# Patient Record
Sex: Female | Born: 1940 | Race: White | Hispanic: No | Marital: Married | Smoking: Never smoker
Health system: Southern US, Community
[De-identification: ages and names within clinical notes are randomized; demographics above are authoritative.]

## PROBLEM LIST (undated history)

## (undated) DIAGNOSIS — I10 Essential (primary) hypertension: Secondary | ICD-10-CM

## (undated) DIAGNOSIS — R42 Dizziness and giddiness: Secondary | ICD-10-CM

## (undated) HISTORY — PX: OTHER SURGICAL HISTORY: SHX169

## (undated) HISTORY — PX: CATARACT EXTRACTION: SUR2

## (undated) HISTORY — DX: Dizziness and giddiness: R42

## (undated) HISTORY — DX: Essential (primary) hypertension: I10

---

## 1973-04-01 HISTORY — PX: PILONIDAL CYST EXCISION: SHX744

## 1998-02-20 ENCOUNTER — Other Ambulatory Visit: Admission: RE | Admit: 1998-02-20 | Discharge: 1998-02-20 | Payer: Self-pay | Admitting: Obstetrics and Gynecology

## 1999-02-21 ENCOUNTER — Other Ambulatory Visit: Admission: RE | Admit: 1999-02-21 | Discharge: 1999-02-21 | Payer: Self-pay | Admitting: Obstetrics and Gynecology

## 2000-05-02 ENCOUNTER — Other Ambulatory Visit: Admission: RE | Admit: 2000-05-02 | Discharge: 2000-05-02 | Payer: Self-pay | Admitting: Obstetrics and Gynecology

## 2000-06-03 ENCOUNTER — Encounter: Admission: RE | Admit: 2000-06-03 | Discharge: 2000-06-03 | Payer: Self-pay | Admitting: Family Medicine

## 2000-06-03 ENCOUNTER — Encounter: Payer: Self-pay | Admitting: Family Medicine

## 2001-05-04 ENCOUNTER — Other Ambulatory Visit: Admission: RE | Admit: 2001-05-04 | Discharge: 2001-05-04 | Payer: Self-pay | Admitting: Obstetrics and Gynecology

## 2002-05-04 ENCOUNTER — Other Ambulatory Visit: Admission: RE | Admit: 2002-05-04 | Discharge: 2002-05-04 | Payer: Self-pay | Admitting: Obstetrics and Gynecology

## 2003-05-06 ENCOUNTER — Other Ambulatory Visit: Admission: RE | Admit: 2003-05-06 | Discharge: 2003-05-06 | Payer: Self-pay | Admitting: Obstetrics and Gynecology

## 2004-05-10 ENCOUNTER — Other Ambulatory Visit: Admission: RE | Admit: 2004-05-10 | Discharge: 2004-05-10 | Payer: Self-pay | Admitting: Obstetrics and Gynecology

## 2005-05-13 ENCOUNTER — Other Ambulatory Visit: Admission: RE | Admit: 2005-05-13 | Discharge: 2005-05-13 | Payer: Self-pay | Admitting: Obstetrics and Gynecology

## 2006-05-21 ENCOUNTER — Other Ambulatory Visit: Admission: RE | Admit: 2006-05-21 | Discharge: 2006-05-21 | Payer: Self-pay | Admitting: Obstetrics and Gynecology

## 2007-06-09 ENCOUNTER — Other Ambulatory Visit: Admission: RE | Admit: 2007-06-09 | Discharge: 2007-06-09 | Payer: Self-pay | Admitting: Obstetrics and Gynecology

## 2008-12-14 ENCOUNTER — Ambulatory Visit: Payer: Self-pay | Admitting: Obstetrics and Gynecology

## 2008-12-23 ENCOUNTER — Ambulatory Visit: Payer: Self-pay | Admitting: Obstetrics and Gynecology

## 2010-01-17 ENCOUNTER — Ambulatory Visit: Payer: Self-pay | Admitting: Obstetrics and Gynecology

## 2010-01-17 ENCOUNTER — Other Ambulatory Visit
Admission: RE | Admit: 2010-01-17 | Discharge: 2010-01-17 | Payer: Self-pay | Source: Home / Self Care | Admitting: Obstetrics and Gynecology

## 2010-01-26 ENCOUNTER — Ambulatory Visit: Payer: Self-pay | Admitting: Obstetrics and Gynecology

## 2011-02-13 ENCOUNTER — Encounter: Payer: Self-pay | Admitting: Obstetrics and Gynecology

## 2011-03-05 DIAGNOSIS — I10 Essential (primary) hypertension: Secondary | ICD-10-CM | POA: Insufficient documentation

## 2011-03-22 ENCOUNTER — Encounter: Payer: Self-pay | Admitting: Obstetrics and Gynecology

## 2011-03-22 ENCOUNTER — Ambulatory Visit (INDEPENDENT_AMBULATORY_CARE_PROVIDER_SITE_OTHER): Payer: Medicare Other | Admitting: Obstetrics and Gynecology

## 2011-03-22 VITALS — BP 122/74 | Ht 66.0 in | Wt 198.0 lb

## 2011-03-22 DIAGNOSIS — N951 Menopausal and female climacteric states: Secondary | ICD-10-CM

## 2011-03-22 DIAGNOSIS — R3915 Urgency of urination: Secondary | ICD-10-CM

## 2011-03-22 DIAGNOSIS — N952 Postmenopausal atrophic vaginitis: Secondary | ICD-10-CM

## 2011-03-22 DIAGNOSIS — R351 Nocturia: Secondary | ICD-10-CM

## 2011-03-22 DIAGNOSIS — Z780 Asymptomatic menopausal state: Secondary | ICD-10-CM

## 2011-03-22 NOTE — Progress Notes (Signed)
Patient came back to see me today for further followup. She continues to have menopausal symptoms but feels she can tolerate them without going back on her HRT which we stopped. She also complains of intermittent vaginal dryness. Once again she feels to be okay without treatment. She has nocturia.  It can be as often as 3 times a night. She has to rush to get to the bathroom sometimes but does not have incontinence. She is up-to-date on mammograms and bone densities. She takes calcium and vitamin D. She is having no vaginal bleeding. She is having no pelvic pain. She did last year have  pain in an area which we thought was the pelvic bone and she has since had x-rays which showed osteoarthritis.  ROS: 12 system review done. See above pertinent positives. Only other positive is  Hypertension.  Physical examination: HEENT within normal limits. Neck: Thyroid not large. No masses. Supraclavicular nodes: not enlarged. Breasts: Examined in both sitting midline position. No skin changes and no masses. Abdomen: Soft no guarding rebound or masses or hernia. Pelvic: External: Within normal limits. BUS: Within normal limits. Vaginal:within normal limits. Good estrogen effect. No evidence of cystocele rectocele or enterocele. Cervix: clean. Uterus: Normal size and shape. Adnexa: No masses. Rectovaginal exam: Confirmatory and negative. Extremities: Within normal limits.  Assessment: #1. Vasomotor symptoms #2. Atrophic vaginitis #3. Nocturia #4. Urgency of urination  Plan: Discussed HRT. Patient declined. She will call when necessary. Discussed drugs for detrussor instability. Patient declined. Continue yearly mammograms.

## 2012-02-14 ENCOUNTER — Encounter: Payer: Self-pay | Admitting: Obstetrics and Gynecology

## 2012-03-23 ENCOUNTER — Ambulatory Visit (INDEPENDENT_AMBULATORY_CARE_PROVIDER_SITE_OTHER): Payer: Medicare Other | Admitting: Obstetrics and Gynecology

## 2012-03-23 ENCOUNTER — Encounter: Payer: Self-pay | Admitting: Obstetrics and Gynecology

## 2012-03-23 VITALS — BP 130/80 | Ht 66.0 in | Wt 190.0 lb

## 2012-03-23 DIAGNOSIS — N952 Postmenopausal atrophic vaginitis: Secondary | ICD-10-CM

## 2012-03-23 DIAGNOSIS — R351 Nocturia: Secondary | ICD-10-CM

## 2012-03-23 DIAGNOSIS — N393 Stress incontinence (female) (male): Secondary | ICD-10-CM

## 2012-03-23 DIAGNOSIS — N951 Menopausal and female climacteric states: Secondary | ICD-10-CM

## 2012-03-23 DIAGNOSIS — R3915 Urgency of urination: Secondary | ICD-10-CM

## 2012-03-23 DIAGNOSIS — R232 Flushing: Secondary | ICD-10-CM

## 2012-03-23 NOTE — Patient Instructions (Addendum)
Consider going to cancer center and discussing BRCA1 and BRCA2 testing due to  maternal aunts early-onset of breast cancer.

## 2012-03-23 NOTE — Progress Notes (Addendum)
Patient came to see me today for further followup. She continues to have some hot flashes. She is also aware vaginal dryness. She previously was on Activella. She does not feel that either problem requires intervention. She is having no vaginal bleeding. She is having no pelvic pain. She has always had normal Pap smears. Her last Pap smear Was 2011. She does have some urinary frequency. She also has nocturia x1-2 per night. She can also lose  urine with coughing laughing and sneezing. She does not have urgency of urination or dysuria. She had a normal mammogram this year. She had a normal bone density in  2010. Her maternal aunt had early onset breast cancer. The maternal aunt's daughter had breast cancer  After 50. This year patient was having a lot of right and left upper quadrant pain. Initially urine cultures were negative. Patient had a CT scan of her abdomen and pelvis as well as ultrasound of her pelvis all of which were normal. She will get [place] copies. Eventually the diagnosis of urinary tract infection was made and after antibiotics her pain went away and has not recurred.  ROS: 12 system review done. Pertinent positives above. Other positive is hypertension.  Physical examination:Christine Mckay present. HEENT within normal limits. Neck: Thyroid not large. No masses. Supraclavicular nodes: not enlarged. Breasts: Examined in both sitting and lying  position. No skin changes and no masses. Abdomen: Soft no guarding rebound or masses or hernia. Pelvic: External: Within normal limits. BUS: Within normal limits. Vaginal:within normal limits. Fair estrogen effect. No evidence of cystocele rectocele or enterocele. Cervix: clean. Uterus: Normal size and shape. Adnexa: No masses. Rectovaginal exam: Confirmatory and negative. Extremities: Within normal limits.  Assessment: #1. Menopausal symptoms including hot flashes and atrophic vaginitis. #2. Urinary frequency and stress incontinence.#3. Early-onset of  breast cancer in maternal aunt.  Plan: We discussed her menopausal symptoms and she does not feel it requires intervention such as reinitiating HRT. She will continue yearly mammograms. She will do Kegel exercises. We discussed BRCA1 and BRCA2 testing because of a family history of early onset breast cancer. She will decide.

## 2012-12-16 ENCOUNTER — Encounter (HOSPITAL_COMMUNITY): Admission: RE | Admit: 2012-12-16 | Payer: Medicare Other | Source: Ambulatory Visit

## 2012-12-16 ENCOUNTER — Encounter (HOSPITAL_COMMUNITY): Payer: Self-pay

## 2015-04-17 ENCOUNTER — Encounter: Payer: Self-pay | Admitting: Gastroenterology

## 2015-06-05 ENCOUNTER — Telehealth: Payer: Self-pay | Admitting: *Deleted

## 2015-06-05 NOTE — Telephone Encounter (Signed)
Pt called c/o vaginal burning and itching x 2 months, last seen in 2013, pt advised she will need appointment. Transferred to front desk

## 2015-06-09 ENCOUNTER — Encounter: Payer: Self-pay | Admitting: Women's Health

## 2015-06-09 ENCOUNTER — Ambulatory Visit (INDEPENDENT_AMBULATORY_CARE_PROVIDER_SITE_OTHER): Payer: Medicare Other | Admitting: Women's Health

## 2015-06-09 ENCOUNTER — Telehealth: Payer: Self-pay | Admitting: *Deleted

## 2015-06-09 VITALS — BP 136/84 | Ht 65.5 in | Wt 189.0 lb

## 2015-06-09 DIAGNOSIS — B373 Candidiasis of vulva and vagina: Secondary | ICD-10-CM

## 2015-06-09 DIAGNOSIS — B3731 Acute candidiasis of vulva and vagina: Secondary | ICD-10-CM

## 2015-06-09 LAB — WET PREP FOR TRICH, YEAST, CLUE
Clue Cells Wet Prep HPF POC: NONE SEEN
Trich, Wet Prep: NONE SEEN
WBC, Wet Prep HPF POC: NONE SEEN
Yeast Wet Prep HPF POC: NONE SEEN

## 2015-06-09 MED ORDER — FLUCONAZOLE 100 MG PO TABS: ORAL_TABLET | ORAL | Status: DC

## 2015-06-09 MED ORDER — FLUCONAZOLE 100 MG PO TABS: 100.0000 mg | ORAL_TABLET | Freq: Every day | ORAL | Status: DC

## 2015-06-09 NOTE — Telephone Encounter (Signed)
That medicine should only be $10 even without insurance ask her if that is okay or we can try to rewrite it.

## 2015-06-09 NOTE — Telephone Encounter (Signed)
Rx rewritten and Rx sent, pt aware.

## 2015-06-09 NOTE — Progress Notes (Signed)
Patient presents with complaint of continued vaginal burning/ itching/ irritation. History of multiple UTIs, most recently treated 04/22/15 with ciprofloxacin 500 mg BID 10 days. Later developed mild vaginal burning/irritation as well as superficial nontender lesions and was treated with Valtrex for suspected HSV, though testing for HSV returned negative, this was all treated at primary care. No history of HSV. Several weeks later developed increased itching and was diagnosed with a yeast infection, treated with 2 tablets of Diflucan, some relief. Most recently has been using Premarin cream with no relief. Sexually active, not recently, married.  Exam: Appears well. Labia and introitus with mild erythema, no lesions noted. 1 small, nontender sebaceous cyst at 1 o'clock externally. Wet prep with Q-tip only: Negative, vaginal cream  noted  Unresolved Yeast vaginitis Postmenopausal/no HRT/no bleeding  Plan: Rx for Diflucan 100 mg given, patient instructed to take 2 tablets today, 2 tablets in 3 days, and 2 tablets in one week only if the itching/irritation has not cleared up. For future antibiotic use, instructed to take 2 tablets at the first day of antibiotics and 2 tablets on the last day of antibiotic use. Patient verbalized understanding of instructions and will return if symptoms fail to improve or as needed.

## 2015-06-09 NOTE — Patient Instructions (Signed)

## 2015-06-09 NOTE — Telephone Encounter (Signed)
Pharmacy called regarding directions for Diflucan 100 mg from today visit, insurance will not cover medication with directions on Rx. Please advise

## 2015-06-13 ENCOUNTER — Ambulatory Visit: Payer: Self-pay | Admitting: Women's Health

## 2015-06-26 ENCOUNTER — Ambulatory Visit (INDEPENDENT_AMBULATORY_CARE_PROVIDER_SITE_OTHER): Payer: Medicare Other | Admitting: Women's Health

## 2015-06-26 ENCOUNTER — Encounter: Payer: Self-pay | Admitting: Women's Health

## 2015-06-26 VITALS — BP 126/80 | Ht 65.0 in | Wt 189.0 lb

## 2015-06-26 DIAGNOSIS — R21 Rash and other nonspecific skin eruption: Secondary | ICD-10-CM | POA: Diagnosis not present

## 2015-06-26 DIAGNOSIS — B373 Candidiasis of vulva and vagina: Secondary | ICD-10-CM

## 2015-06-26 DIAGNOSIS — B3731 Acute candidiasis of vulva and vagina: Secondary | ICD-10-CM

## 2015-06-26 LAB — WET PREP FOR TRICH, YEAST, CLUE
Clue Cells Wet Prep HPF POC: NONE SEEN
Trich, Wet Prep: NONE SEEN
Yeast Wet Prep HPF POC: NONE SEEN

## 2015-06-26 MED ORDER — FLUCONAZOLE 100 MG PO TABS: ORAL_TABLET | ORAL | Status: DC

## 2015-06-26 MED ORDER — NYSTATIN-TRIAMCINOLONE 100000-0.1 UNIT/GM-% EX OINT: 1.0000 "application " | TOPICAL_OINTMENT | Freq: Two times a day (BID) | CUTANEOUS | Status: AC

## 2015-06-26 NOTE — Progress Notes (Signed)
Patient ID: Orinda KennerBrenda A Furr, female   DOB: 11/01/1940, 75 y.o.   MRN: [id number] Presents with complaint of continued vaginal burning, occasional shooting-type pains in the vagina but itching has mostly resolved. Denies vaginal discharge or urinary symptoms. Was treated with Cipro for 10 days January 21 at primary care, yeast symptoms started after completing medication. Was found to have yeast and treated with Diflucan on 06/09/2015, reports being approximately 30% better, burning vaginal sensation most bothersome.  Exam: Appears well. External genitalia within normal limits, labia minora and introitus erythematous, wet prep done with a Q-tip, negative. No odor, fissures or discharge noted.   Unresolved [place] vaginitis  Plan: Diflucan 100 mg by mouth every other day for 1 week, loose clothing, open to air as able, small amount of Mycolog to introitus once daily. Has annual exam scheduled in 2 weeks will evaluate.

## 2015-06-26 NOTE — Patient Instructions (Signed)

## 2015-07-04 ENCOUNTER — Encounter: Payer: Self-pay | Admitting: Women's Health

## 2015-07-11 ENCOUNTER — Encounter: Payer: Self-pay | Admitting: Women's Health

## 2015-07-11 ENCOUNTER — Ambulatory Visit (INDEPENDENT_AMBULATORY_CARE_PROVIDER_SITE_OTHER): Payer: Medicare Other | Admitting: Women's Health

## 2015-07-11 VITALS — BP 126/80 | Ht 65.0 in | Wt 182.0 lb

## 2015-07-11 DIAGNOSIS — N952 Postmenopausal atrophic vaginitis: Secondary | ICD-10-CM

## 2015-07-11 NOTE — Progress Notes (Signed)
Christine KennerBrenda A Farquhar 09/29/1940 [id number]    History:    Presents for breast and pelvic exam. Postmenopausal on no HRT with no bleeding. Recently had the flu. Had been on Activella in the past. Normal Pap and mammogram history. Had a normal DEXA at primary care. First colonoscopy had several polyps and has had colonoscopies every 5 years which have been negative. Has had mammograms annually records are not in computer. Hypertension primary care manages. Had a severe UTI in January was treated with Cipro for 10 days and then had recurrent yeast that was difficult to treat and is now symptom free.  Past medical history, past surgical history, family history and social history were all reviewed and documented in the EPIC chart. 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 Husband history of colon cancer and MI doing okay.  ROS:  A ROS was performed and pertinent positives and negatives are included.  Exam:  Filed Vitals:   07/11/15 1010  BP: 126/80    General appearance:  Normal Thyroid:  Symmetrical, normal in size, without palpable masses or nodularity. Respiratory  Auscultation:  Clear without wheezing or rhonchi Cardiovascular  Auscultation:  Regular rate, without rubs, murmurs or gallops  Edema/varicosities:  Not grossly evident Abdominal  Soft,nontender, without masses, guarding or rebound.  Liver/spleen:  No organomegaly noted  Hernia:  None appreciated  Skin  Inspection:  Grossly normal   Breasts: Examined lying and sitting.     Right: Without masses, retractions, discharge or axillary adenopathy.     Left: Without masses, retractions, discharge or axillary adenopathy. Gentitourinary   Inguinal/mons:  Normal without inguinal adenopathy  External genitalia:  Normal  BUS/Urethra/Skene's glands:  Normal  Vagina:  Mild vaginal atrophy  Cervix:  Normal  Uterus:  normal in size, shape and contour.  Midline and mobile  Adnexa/parametria:     Rt: Without masses or tenderness.   Lt: Without masses or  tenderness.  Anus and perineum: Normal  Digital rectal exam: Normal sphincter tone without palpated masses or tenderness  Assessment/Plan:  75 y.o. MWF G0 for breast and pelvic exam without complaint.  Postmenopausal on no HRT with no bleeding Vaginal atrophy Hypertension-primary care manages labs and meds HSV-rare outbreaks  Plan:  Continue vaginal lubricants with intercourse. SBE's, continue annual screening mammogram, 3-D tomography reviewed and encouraged to have reports sent to office. Exercise, calcium rich diet, vitamin D 1000 daily reports normal vitamin D level at primary care. Home safety, fall prevention and importance of weightbearing exercise reviewed. Continue annual flu vaccine has had Zostavax and Pneumovax.  Harrington ChallengerYOUNG,Meredeth Furber J WHNP, 1:08 PM 07/11/2015

## 2015-07-11 NOTE — Patient Instructions (Signed)
DASH Eating Plan DASH stands for "Dietary Approaches to Stop Hypertension." The DASH eating plan is a healthy eating plan that has been shown to reduce high blood pressure (hypertension). Additional health benefits may include reducing the risk of type 2 diabetes mellitus, heart disease, and stroke. The DASH eating plan may also help with weight loss. WHAT DO I NEED TO KNOW ABOUT THE DASH EATING PLAN? For the DASH eating plan, you will follow these general guidelines:  Choose foods with a percent daily value for sodium of less than 5% (as listed on the food label).  Use salt-free seasonings or herbs instead of table salt or sea salt.  Check with your health care provider or pharmacist before using salt substitutes.  Eat lower-sodium products, often labeled as "lower sodium" or "no salt added."  Eat fresh foods.  Eat more vegetables, fruits, and low-fat dairy products.  Choose whole grains. Look for the word "whole" as the first word in the ingredient list.  Choose fish and skinless chicken or [place] more often than red meat. Limit fish, poultry, and meat to 6 oz (170 g) each day.  Limit sweets, desserts, sugars, and sugary drinks.  Choose heart-healthy fats.  Limit cheese to 1 oz (28 g) per day.  Eat more home-cooked food and less restaurant, buffet, and fast food.  Limit fried foods.  Cook foods using methods other than frying.  Limit canned vegetables. If you do use them, rinse them well to decrease the sodium.  When eating at a restaurant, ask that your food be prepared with less salt, or no salt if possible. WHAT FOODS CAN I EAT? Seek help from a dietitian for individual calorie needs. Grains Whole grain or whole wheat bread. Brown rice. Whole grain or whole wheat pasta. Quinoa, bulgur, and whole grain cereals. Low-sodium cereals. Corn or whole wheat flour tortillas. Whole grain cornbread. Whole grain crackers. Low-sodium crackers. Vegetables Fresh or frozen vegetables  (raw, steamed, roasted, or grilled). Low-sodium or reduced-sodium tomato and vegetable juices. Low-sodium or reduced-sodium tomato sauce and paste. Low-sodium or reduced-sodium canned vegetables.  Fruits All fresh, canned (in natural juice), or frozen fruits. Meat and Other Protein Products Ground beef (85% or leaner), grass-fed beef, or beef trimmed of fat. Skinless chicken or [place]. Ground chicken or [place]. Pork trimmed of fat. All fish and seafood. Eggs. Dried beans, peas, or lentils. Unsalted nuts and seeds. Unsalted canned beans. Dairy Low-fat dairy products, such as skim or 1% milk, 2% or reduced-fat cheeses, low-fat ricotta or cottage cheese, or plain low-fat yogurt. Low-sodium or reduced-sodium cheeses. Fats and Oils Tub margarines without trans fats. Light or reduced-fat mayonnaise and salad dressings (reduced sodium). Avocado. Safflower, olive, or canola oils. Natural peanut or almond butter. Other Unsalted popcorn and pretzels. The items listed above may not be a complete list of recommended foods or beverages. Contact your dietitian for more options. WHAT FOODS ARE NOT RECOMMENDED? Grains White bread. White pasta. White rice. Refined cornbread. Bagels and croissants. Crackers that contain trans fat. Vegetables Creamed or fried vegetables. Vegetables in a cheese sauce. Regular canned vegetables. Regular canned tomato sauce and paste. Regular tomato and vegetable juices. Fruits Dried fruits. Canned fruit in light or heavy syrup. Fruit juice. Meat and Other Protein Products Fatty cuts of meat. Ribs, chicken wings, bacon, sausage, bologna, salami, chitterlings, fatback, hot dogs, bratwurst, and packaged luncheon meats. Salted nuts and seeds. Canned beans with salt. Dairy Whole or 2% milk, cream, half-and-half, and cream cheese. Whole-fat or sweetened yogurt. Full-fat  cheeses or blue cheese. Nondairy creamers and whipped toppings. Processed cheese, cheese spreads, or cheese  curds. Condiments Onion and garlic salt, seasoned salt, table salt, and sea salt. Canned and packaged gravies. Worcestershire sauce. Tartar sauce. Barbecue sauce. Teriyaki sauce. Soy sauce, including reduced sodium. Steak sauce. Fish sauce. Oyster sauce. Cocktail sauce. Horseradish. Ketchup and mustard. Meat flavorings and tenderizers. Bouillon cubes. Hot sauce. Tabasco sauce. Marinades. Taco seasonings. Relishes. Fats and Oils Butter, stick margarine, lard, shortening, ghee, and bacon fat. Coconut, palm kernel, or palm oils. Regular salad dressings. Other Pickles and olives. Salted popcorn and pretzels. The items listed above may not be a complete list of foods and beverages to avoid. Contact your dietitian for more information. WHERE CAN I FIND MORE INFORMATION? National Heart, Lung, and Blood Institute: www.nhlbi.nih.gov/health/health-topics/topics/dash/   This information is not intended to replace advice given to you by your health care provider. Make sure you discuss any questions you have with your health care provider.   Document Released: 03/07/2011 Document Revised: 04/08/2014 Document Reviewed: 01/20/2013 Elsevier Interactive Patient Education 2016 Elsevier Inc. Menopause is a normal process in which your reproductive ability comes to an end. This process happens gradually over a span of months to years, usually between the ages of 48 and 55. Menopause is complete when you have missed 12 consecutive menstrual periods. It is important to talk with your health care provider about some of the most common conditions that affect postmenopausal women, such as heart disease, cancer, and bone loss (osteoporosis). Adopting a healthy lifestyle and getting preventive care can help to promote your health and wellness. Those actions can also lower your chances of developing some of these common conditions. WHAT SHOULD I KNOW ABOUT MENOPAUSE? During menopause, you may experience a number of symptoms,  such as:  Moderate-to-severe hot flashes.  Night sweats.  Decrease in sex drive.  Mood swings.  Headaches.  Tiredness.  Irritability.  Memory problems.  Insomnia. Choosing to treat or not to treat menopausal changes is an individual decision that you make with your health care provider. WHAT SHOULD I KNOW ABOUT HORMONE REPLACEMENT THERAPY AND SUPPLEMENTS? Hormone therapy products are effective for treating symptoms that are associated with menopause, such as hot flashes and night sweats. Hormone replacement carries certain risks, especially as you become older. If you are thinking about using estrogen or estrogen with progestin treatments, discuss the benefits and risks with your health care provider. WHAT SHOULD I KNOW ABOUT HEART DISEASE AND STROKE? Heart disease, heart attack, and stroke become more likely as you age. This may be due, in part, to the hormonal changes that your body experiences during menopause. These can affect how your body processes dietary fats, triglycerides, and cholesterol. Heart attack and stroke are both medical emergencies. There are many things that you can do to help prevent heart disease and stroke:  Have your blood pressure checked at least every 1-2 years. High blood pressure causes heart disease and increases the risk of stroke.  If you are 55-79 years old, ask your health care provider if you should take aspirin to prevent a heart attack or a stroke.  Do not use any tobacco products, including cigarettes, chewing tobacco, or electronic cigarettes. If you need help quitting, ask your health care provider.  It is important to eat a healthy diet and maintain a healthy weight.  Be sure to include plenty of vegetables, fruits, low-fat dairy products, and lean protein.  Avoid eating foods that are high in solid fats, added   sugars, or salt (sodium).  Get regular exercise. This is one of the most important things that you can do for your  health.  Try to exercise for at least 150 minutes each week. The type of exercise that you do should increase your heart rate and make you sweat. This is known as moderate-intensity exercise.  Try to do strengthening exercises at least twice each week. Do these in addition to the moderate-intensity exercise.  Know your numbers.Ask your health care provider to check your cholesterol and your blood glucose. Continue to have your blood tested as directed by your health care provider. WHAT SHOULD I KNOW ABOUT CANCER SCREENING? There are several types of cancer. Take the following steps to reduce your risk and to catch any cancer development as early as possible. Breast Cancer  Practice breast self-awareness.  This means understanding how your breasts normally appear and feel.  It also means doing regular breast self-exams. Let your health care provider know about any changes, no matter how small.  If you are 69 or older, have a clinician do a breast exam (clinical breast exam or CBE) every year. Depending on your age, family history, and medical history, it may be recommended that you also have a yearly breast X-ray (mammogram).  If you have a family history of breast cancer, talk with your health care provider about genetic screening.  If you are at high risk for breast cancer, talk with your health care provider about having an MRI and a mammogram every year.  Breast cancer (BRCA) gene test is recommended for women who have family members with BRCA-related cancers. Results of the assessment will determine the need for genetic counseling and BRCA1 and for BRCA2 testing. BRCA-related cancers include these types:  Breast. This occurs in males or females.  Ovarian.  Tubal. This may also be called fallopian tube cancer.  Cancer of the abdominal or pelvic lining (peritoneal cancer).  Prostate.  Pancreatic. Cervical, Uterine, and Ovarian Cancer Your health care provider may recommend  that you be screened regularly for cancer of the pelvic organs. These include your ovaries, uterus, and vagina. This screening involves a pelvic exam, which includes checking for microscopic changes to the surface of your cervix (Pap test).  For women ages 21-65, health care providers may recommend a pelvic exam and a Pap test every three years. For women ages 96-65, they may recommend the Pap test and pelvic exam, combined with testing for human papilloma virus (HPV), every five years. Some types of HPV increase your risk of cervical cancer. Testing for HPV may also be done on women of any age who have unclear Pap test results.  Other health care providers may not recommend any screening for nonpregnant women who are considered low risk for pelvic cancer and have no symptoms. Ask your health care provider if a screening pelvic exam is right for you.  If you have had past treatment for cervical cancer or a condition that could lead to cancer, you need Pap tests and screening for cancer for at least 20 years after your treatment. If Pap tests have been discontinued for you, your risk factors (such as having a new sexual partner) need to be reassessed to determine if you should start having screenings again. Some women have medical problems that increase the chance of getting cervical cancer. In these cases, your health care provider may recommend that you have screening and Pap tests more often.  If you have a family history of uterine  cancer or ovarian cancer, talk with your health care provider about genetic screening.  If you have vaginal bleeding after reaching menopause, tell your health care provider.  There are currently no reliable tests available to screen for ovarian cancer. Lung Cancer Lung cancer screening is recommended for adults 20-56 years old who are at high risk for lung cancer because of a history of smoking. A yearly low-dose CT scan of the lungs is recommended if you:  Currently  smoke.  Have a history of at least 30 pack-years of smoking and you currently smoke or have quit within the past 15 years. A pack-year is smoking an average of one pack of cigarettes per day for one year. Yearly screening should:  Continue until it has been 15 years since you quit.  Stop if you develop a health problem that would prevent you from having lung cancer treatment. Colorectal Cancer  This type of cancer can be detected and can often be prevented.  Routine colorectal cancer screening usually begins at age 1 and continues through age 2.  If you have risk factors for colon cancer, your health care provider may recommend that you be screened at an earlier age.  If you have a family history of colorectal cancer, talk with your health care provider about genetic screening.  Your health care provider may also recommend using home test kits to check for hidden blood in your stool.  A small camera at the end of a tube can be used to examine your colon directly (sigmoidoscopy or colonoscopy). This is done to check for the earliest forms of colorectal cancer.  Direct examination of the colon should be repeated every 5-10 years until age 8. However, if early forms of precancerous polyps or small growths are found or if you have a family history or genetic risk for colorectal cancer, you may need to be screened more often. Skin Cancer  Check your skin from head to toe regularly.  Monitor any moles. Be sure to tell your health care provider:  About any new moles or changes in moles, especially if there is a change in a mole's shape or color.  If you have a mole that is larger than the size of a pencil eraser.  If any of your family members has a history of skin cancer, especially at a young age, talk with your health care provider about genetic screening.  Always use sunscreen. Apply sunscreen liberally and repeatedly throughout the day.  Whenever you are outside, protect  yourself by wearing long sleeves, pants, a wide-brimmed hat, and sunglasses. WHAT SHOULD I KNOW ABOUT OSTEOPOROSIS? Osteoporosis is a condition in which bone destruction happens more quickly than new bone creation. After menopause, you may be at an increased risk for osteoporosis. To help prevent osteoporosis or the bone fractures that can happen because of osteoporosis, the following is recommended:  If you are 70-70 years old, get at least 1,000 mg of calcium and at least 600 mg of vitamin D per day.  If you are older than age 61 but younger than age 32, get at least 1,200 mg of calcium and at least 600 mg of vitamin D per day.  If you are older than age 72, get at least 1,200 mg of calcium and at least 800 mg of vitamin D per day. Smoking and excessive alcohol intake increase the risk of osteoporosis. Eat foods that are rich in calcium and vitamin D, and do weight-bearing exercises several times each week as  directed by your health care provider. WHAT SHOULD I KNOW ABOUT HOW MENOPAUSE AFFECTS [place]? Depression may occur at any age, but it is more common as you become older. Common symptoms of depression include:  Low or sad mood.  Changes in sleep patterns.  Changes in appetite or eating patterns.  Feeling an overall lack of motivation or enjoyment of activities that you previously enjoyed.  Frequent crying spells. Talk with your health care provider if you think that you are experiencing depression. WHAT SHOULD I KNOW ABOUT IMMUNIZATIONS? It is important that you get and maintain your immunizations. These include:  Tetanus, diphtheria, and pertussis (Tdap) booster vaccine.  Influenza every year before the flu season begins.  Pneumonia vaccine.  Shingles vaccine. Your health care provider may also recommend other immunizations.   This information is not intended to replace advice given to you by your health care provider. Make sure you discuss any questions you have  with your health care provider.   Document Released: 05/10/2005 Document Revised: 04/08/2014 Document Reviewed: 11/18/2013 Elsevier Interactive Patient Education [employer].

## 2015-12-14 ENCOUNTER — Encounter: Payer: Self-pay | Admitting: Gynecology

## 2016-08-14 ENCOUNTER — Encounter: Payer: Self-pay | Admitting: Gynecology

## 2018-12-30 ENCOUNTER — Other Ambulatory Visit (HOSPITAL_BASED_OUTPATIENT_CLINIC_OR_DEPARTMENT_OTHER): Payer: Self-pay | Admitting: Internal Medicine

## 2018-12-30 DIAGNOSIS — Z1231 Encounter for screening mammogram for malignant neoplasm of breast: Secondary | ICD-10-CM

## 2019-01-07 ENCOUNTER — Encounter: Payer: Self-pay | Admitting: Gynecology

## 2019-01-12 ENCOUNTER — Ambulatory Visit (HOSPITAL_BASED_OUTPATIENT_CLINIC_OR_DEPARTMENT_OTHER)
Admission: RE | Admit: 2019-01-12 | Discharge: 2019-01-12 | Disposition: A | Discharge status: Home / Self Care | Payer: Medicare Other | Source: Ambulatory Visit | Attending: Internal Medicine | Admitting: Internal Medicine

## 2019-01-12 ENCOUNTER — Other Ambulatory Visit: Payer: Self-pay

## 2019-01-12 DIAGNOSIS — Z1231 Encounter for screening mammogram for malignant neoplasm of breast: Secondary | ICD-10-CM | POA: Diagnosis not present

## 2019-05-17 ENCOUNTER — Ambulatory Visit: Admit: 2019-05-17 | Payer: Medicare Other | Admitting: Orthopedic Surgery

## 2019-05-17 SURGERY — ARTHROPLASTY, KNEE, TOTAL
Anesthesia: Choice | Site: Knee | Laterality: Right

## 2019-06-17 NOTE — H&P (Signed)
TOTAL KNEE ADMISSION H&P  Patient is being admitted for right total knee arthroplasty.  Subjective:  Chief Complaint: Right knee pain.  HPI: Christine Mckay, 79 y.o. female has a history of pain and functional disability in the right knee due to arthritis and has failed non-surgical conservative treatments for greater than 12 weeks to include corticosteriod injections, viscosupplementation injections, use of assistive devices and activity modification. Onset of symptoms was gradual, starting several years ago with gradually worsening course since that time. The patient noted no past surgery on the right knee.  Patient currently rates pain in the right knee at 8 out of 10 with activity. Patient has worsening of pain with activity and weight bearing, pain that interferes with activities of daily living, crepitus and joint swelling. 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 There is no active infection.  Patient Active Problem List   Diagnosis Date Noted  . Hypertension     Past Medical History:  Diagnosis Date  . Hypertension   . Vertigo     Past Surgical History:  Procedure Laterality Date  . CATARACT EXTRACTION  2003 AND 2004  . LASER SURGERY OF EYE    . PILONIDAL CYST EXCISION  1975    Prior to Admission medications   Medication Sig Start Date End Date Taking? Authorizing Provider  aspirin 81 MG tablet Take 81 mg by mouth daily.    [provider]  b complex vitamins capsule Take 1 capsule by mouth daily.    [provider]  Calcium Carbonate-Vitamin D (CALCIUM-D PO) Take by mouth.      [provider]  Multiple Vitamin (MULTIVITAMIN) capsule Take 1 capsule by mouth daily.      [provider]  nystatin-triamcinolone ointment (MYCOLOG) Apply 1 application topically 2 (two) times daily. 06/26/15   Harrington Challenger, NP  Omega-3 Fatty Acids (SALMON OIL  PO) Take by mouth.      [provider]  ramipril (ALTACE) 5 MG capsule Take 1 capsule by mouth daily. 07/07/15   [provider]  valACYclovir (VALTREX) 500 MG tablet Take 1,000 mg by mouth as needed. 04/27/15   [provider]    Allergies  Allergen Reactions  . Bextra [Valdecoxib]   . Nitrofurantoin Hives    Social History   Socioeconomic History  . Marital status: Married    Spouse name: Not on file  . Number of children: Not on file  . Years of education: Not on file  . Highest education level: Not on file  Occupational History  . Not on file  Tobacco Use  . Smoking status: Never Smoker  Substance and Sexual Activity  . Alcohol use: No    Alcohol/week: 0.0 standard drinks    Comment: socially  . Drug use: No  . Sexual activity: Yes    Birth control/protection: Post-menopausal  Other Topics Concern  . Not on file  Social History Narrative  . Not on file   Social Determinants of Health   Financial Resource Strain:   . Difficulty of Paying Living Expenses:   Food Insecurity:   . Worried About Programme researcher, broadcasting/film/video in the Last Year:   . Barista in the Last Year:   Transportation Needs:   . Freight forwarder (Medical):   Marland Kitchen Lack of Transportation (Non-Medical):   Physical Activity:   . Days of Exercise per Week:   . Minutes of Exercise  per Session:   Stress:   . Feeling of Stress :   Social Connections:   . Frequency of Communication with Friends and Family:   . Frequency of Social Gatherings with Friends and Family:   . Attends Religious Services:   . Active Member of Clubs or Organizations:   . Attends Banker Meetings:   Marland Kitchen Marital Status:   Intimate Partner Violence:   . Fear of Current or Ex-Partner:   . Emotionally Abused:   Marland Kitchen Physically Abused:   . Sexually Abused:       Tobacco Use:   . Smoking Tobacco Use:   . Smokeless Tobacco Use:    Social History   Substance and Sexual Activity  Alcohol  Use No  . Alcohol/week: 0.0 standard drinks   Comment: socially    Family History  Problem Relation Age of Onset  . Hypertension Sister   . Heart disease Brother   . Breast cancer Maternal Aunt        Age 23's  . Breast cancer Cousin        MATERNAL Age 36  . Heart disease Father   . Hypertension Father     Review of Systems  Constitutional: Negative for chills and fever.  HENT: Negative for congestion, sore throat and tinnitus.   Eyes: Negative for double vision, photophobia and pain.  Respiratory: Negative for cough, shortness of breath and wheezing.   Cardiovascular: Negative for chest pain, palpitations and orthopnea.  Gastrointestinal: Negative for heartburn, nausea and vomiting.  Genitourinary: Negative for dysuria, frequency and urgency.  Musculoskeletal: Positive for joint pain.  Neurological: Negative for dizziness, weakness and headaches.    Objective:  Physical Exam: Well nourished and well developed.  General: Alert and oriented x3, cooperative and pleasant, no acute distress.  Head: normocephalic, atraumatic, neck supple.  Eyes: EOMI.  Respiratory: breath sounds clear in all fields, no wheezing, rales, or rhonchi. Cardiovascular: Regular rate and rhythm, no murmurs, gallops or rubs.  Abdomen: non-tender to palpation and soft, normoactive bowel sounds. Musculoskeletal:  Right Knee Exam: No effusion. Slight valgus deformity. Range of motion is 5 to 125 degrees. Moderate crepitus on range of motion of the knee. Positive lateral, greater than medial, joint line tenderness. Stable knee.  Calves soft and nontender. Motor function intact in LE. Strength 5/5 LE bilaterally. Neuro: Distal pulses 2+. Sensation to light touch intact in LE.  Vital signs in last 24 hours: Blood pressure: 116/84 mmHg  Imaging Review Plain radiographs demonstrate severe degenerative joint disease of the right knee. The overall alignment is mild valgus. The bone quality appears to  be adequate for age and reported activity level.  Assessment/Plan:  End stage arthritis, right knee   The patient history, physical examination, clinical judgment of the provider and imaging studies are consistent with end stage degenerative joint disease of the right knee and total knee arthroplasty is deemed medically necessary. The treatment options including medical management, injection therapy arthroscopy and arthroplasty were discussed at length. The risks and benefits of total knee arthroplasty were presented and reviewed. The risks due to aseptic loosening, infection, stiffness, patella tracking problems, thromboembolic complications and other imponderables were discussed. The patient acknowledged the explanation, agreed to proceed with the plan and consent was signed. Patient is being admitted for inpatient treatment for surgery, pain control, PT, OT, prophylactic antibiotics, VTE prophylaxis, progressive ambulation and ADLs and discharge planning. The patient is planning to be discharged home.  Anticipated LOS equal to or greater than  2 midnights due to - Age 51 and older with one or more of the following:  - Obesity  - Expected need for hospital services (PT, OT, Nursing) required for safe  discharge  - Anticipated need for postoperative skilled nursing care or inpatient rehab  - Active co-morbidities: Stroke OR   - Unanticipated findings during/Post Surgery: None  - Patient is a high risk of re-admission due to: None  Therapy Plans: Outpatient therapy at [facility]) Disposition: Home with husband Planned DVT Prophylaxis: Xarelto 10 mg QD (hx mini stroke) DME Needed: Gilford Rile, 3-in-1 PCP: Doug Sou, DO (has been cleared) TXA: IV Allergies: Nitrofurantoin (rxn unknown) Anesthesia Concerns: None BMI: 32.4  - Patient was instructed on what medications to stop prior to surgery. - Follow-up visit in 2 weeks with Dr. Wynelle Link - Begin physical therapy following  surgery - Pre-operative lab work as pre-surgical testing - Prescriptions will be provided in hospital at time of discharge  Theresa Duty, PA-C Orthopedic Surgery [facility] [place]

## 2019-06-24 NOTE — Patient Instructions (Signed)
DUE TO COVID-19 ONLY ONE VISITOR IS ALLOWED TO COME WITH YOU AND STAY IN THE WAITING ROOM ONLY DURING PRE OP AND PROCEDURE DAY OF SURGERY. THE 1 VISITOR MAY VISIT WITH YOU AFTER SURGERY IN YOUR PRIVATE ROOM DURING VISITING HOURS ONLY!  YOU NEED TO HAVE A COVID 19 TEST ON__4/1/21_____ @__9 :30_____, THIS TEST MUST BE DONE BEFORE SURGERY, COME  [place] , [id number].  ([place]) ONCE YOUR COVID TEST IS COMPLETED, PLEASE BEGIN THE QUARANTINE INSTRUCTIONS AS OUTLINED IN YOUR HANDOUT.                Christine Mckay   Your procedure is scheduled on: 07/05/19   Report to [facility] Main  Entrance   Report to admitting at  11:15 AM     Call this number if you have problems the morning of surgery [place], NO CHEWING GUM [place].   Do not eat food After Midnight  . YOU MAY HAVE CLEAR LIQUIDS FROM MIDNIGHT UNTIL 10:30 AM.    CLEAR LIQUID DIET   Foods Allowed                                                                     Foods Excluded  Coffee and tea, regular and decaf                             liquids that you cannot  Plain Jell-O any favor except red or purple                                           see through such as: Fruit ices (not with fruit pulp)                                     milk, soups, orange juice  Iced Popsicles                                    All solid food Carbonated beverages, regular and diet                                    Cranberry, grape and apple juices Sports drinks like Gatorade Lightly seasoned clear broth or consume(fat free) Sugar, honey syrup       At 10:30 AM Please finish the prescribed Pre-Surgery  Drink  . Nothing by mouth after you finish the  drink !    Take these medicines the morning of surgery with A SIP OF WATER: none                                 You may not have any metal on your body including  hair pins and              piercings  Do not wear jewelry, make-up, lotions, powders or perfumes, deodorant             Do not wear nail polish on your fingernails.  Do not shave  48 hours prior to surgery.     Do not bring valuables to the hospital. [facility] IS NOT             RESPONSIBLE   FOR VALUABLES.  Contacts, dentures or bridgework may not be worn into surgery.      Patients discharged the day of surgery will not be allowed to drive home . IF YOU ARE HAVING SURGERY AND GOING HOME THE SAME DAY, YOU MUST HAVE AN ADULT TO DRIVE YOU HOME AND BE WITH YOU FOR 24 HOURS. YOU MAY GO HOME BY TAXI OR UBER OR ORTHERWISE, BUT AN ADULT MUST ACCOMPANY YOU HOME AND STAY WITH YOU FOR 24 HOURS.  Name and phone number of your driver:  Special Instructions: N/A              Please read over the following fact sheets you were given: _____________________________________________________________________             [facility] - Preparing for Surgery Before surgery, you can play an important role.  Because skin is not sterile, your skin needs to be as free of germs as possible.   You can reduce the number of germs on your skin by washing with CHG (chlorahexidine gluconate) soap before surgery.   CHG is an antiseptic cleaner which kills germs and bonds with the skin to continue killing germs even after washing. Please DO NOT use if you have an allergy to CHG or antibacterial soaps.   If your skin becomes reddened/irritated stop using the CHG and inform your nurse when you arrive at Short Stay. Do not shave (including legs and underarms) for at least 48 hours prior to the first CHG shower.    Please follow these instructions carefully:  1.  Shower with CHG Soap the night before surgery and the  morning of Surgery.  2.  If you choose to wash your hair, wash your hair first as usual with your  normal  shampoo.  3.  After you shampoo, rinse your hair and body thoroughly to remove the  shampoo.                                         4.  Use CHG as you would any other liquid soap.  You can apply chg directly  to the skin and wash                       Gently with a scrungie or clean washcloth.  5.  Apply the CHG Soap to your body ONLY FROM THE NECK DOWN.   Do not use on face/ open                           Wound or open sores. Avoid contact with eyes, ears mouth and genitals (private parts).                       Wash face,  Genitals (private parts) with your normal soap.  6.  Wash thoroughly, paying special attention to the area where your surgery  will be performed.  7.  Thoroughly rinse your body with warm water from the neck down.  8.  DO NOT shower/wash with your normal soap after using and rinsing off  the CHG Soap.             9.  Pat yourself dry with a clean towel.            10.  Wear clean pajamas.            11.  Place clean sheets on your bed the night of your first shower and do not  sleep with pets. Day of Surgery : Do not apply any lotions/deodorants the morning of surgery.  Please wear clean clothes to the hospital/surgery center.  FAILURE TO FOLLOW THESE INSTRUCTIONS MAY RESULT IN THE CANCELLATION OF YOUR SURGERY PATIENT SIGNATURE_________________________________  NURSE SIGNATURE__________________________________  ________________________________________________________________________   Christine Mckay  An incentive spirometer is a tool that can help keep your lungs clear and active. This tool measures how well you are filling your lungs with each breath. Taking long deep breaths may help reverse or decrease the chance of developing breathing (pulmonary) problems (especially infection) following:  A long period of time when you are unable to move or be active. BEFORE THE PROCEDURE   If the spirometer includes an indicator to show your best effort, your nurse or respiratory therapist will set it to a desired goal.  If possible, sit up straight or  lean slightly forward. Try not to slouch.  Hold the incentive spirometer in an upright position. INSTRUCTIONS FOR USE  1. Sit on the edge of your bed if possible, or sit up as far as you can in bed or on a chair. 2. Hold the incentive spirometer in an upright position. 3. Breathe out normally. 4. Place the mouthpiece in your mouth and seal your lips tightly around it. 5. Breathe in slowly and as deeply as possible, raising the piston or the ball toward the top of the column. 6. Hold your breath for 3-5 seconds or for as long as possible. Allow the piston or ball to fall to the bottom of the column. 7. Remove the mouthpiece from your mouth and breathe out normally. 8. Rest for a few seconds and repeat Steps 1 through 7 at least 10 times every 1-2 hours when you are awake. Take your time and take a few normal breaths between deep breaths. 9. The spirometer may include an indicator to show your best effort. Use the indicator as a goal to work toward during each repetition. 10. After each set of 10 deep breaths, practice coughing to be sure your lungs are clear. If you have an incision (the cut made at the time of surgery), support your incision when coughing by placing a pillow or rolled up towels firmly against it. Once you are able to get out of bed, walk around indoors and cough well. You may stop using the incentive spirometer when instructed by your caregiver.  RISKS AND COMPLICATIONS  Take your time so you do not get dizzy or light-headed.  If you are in pain, you may need to take or ask for pain medication before doing incentive spirometry. It is harder to take a deep breath if you are having pain. AFTER USE  Rest and breathe slowly and easily.  It can be helpful to keep track of a log of your progress. Your caregiver  can provide you with a simple table to help with this. If you are using the spirometer at home, follow these instructions: SEEK MEDICAL CARE IF:   You are having  difficultly using the spirometer.  You have trouble using the spirometer as often as instructed.  Your pain medication is not giving enough relief while using the spirometer.  You develop fever of 100.5 F (38.1 C) or higher. SEEK IMMEDIATE MEDICAL CARE IF:   You cough up bloody sputum that had not been present before.  You develop fever of 102 F (38.9 C) or greater.  You develop worsening pain at or near the incision site. MAKE SURE YOU:   Understand these instructions.  Will watch your condition.  Will get help right away if you are not doing well or get worse. Document Released: 07/29/2006 Document Revised: 06/10/2011 Document Reviewed: 09/29/2006 Exit[employer] Patient Information 2014 [employer], [place].   ________________________________________________________________________  WHAT IS A BLOOD TRANSFUSION? Blood Transfusion Information  A transfusion is the replacement of blood or some of its parts. Blood is made up of multiple cells which provide different functions.  Red blood cells carry oxygen and are used for blood loss replacement.  White blood cells fight against infection.  Platelets control bleeding.  Plasma helps clot blood.  Other blood products are available for specialized needs, such as hemophilia or other clotting disorders. BEFORE THE TRANSFUSION  Who gives blood for transfusions?   Healthy volunteers who are fully evaluated to make sure their blood is safe. This is blood bank blood. Transfusion therapy is the safest it has ever been in the practice of medicine. Before blood is taken from a donor, a complete history is taken to make sure that person has no history of diseases nor engages in risky social behavior (examples are intravenous drug use or sexual activity with multiple partners). The donor's travel history is screened to minimize risk of transmitting infections, such as malaria. The donated blood is tested for signs of infectious diseases, such as  HIV and hepatitis. The blood is then tested to be sure it is compatible with you in order to minimize the chance of a transfusion reaction. If you or a relative donates blood, this is often done in anticipation of surgery and is not appropriate for emergency situations. It takes many days to process the donated blood. RISKS AND COMPLICATIONS Although transfusion therapy is very safe and saves many lives, the main dangers of transfusion include:   Getting an infectious disease.  Developing a transfusion reaction. This is an allergic reaction to something in the blood you were given. Every precaution is taken to prevent this. The decision to have a blood transfusion has been considered carefully by your caregiver before blood is given. Blood is not given unless the benefits outweigh the risks. AFTER THE TRANSFUSION  Right after receiving a blood transfusion, you will usually feel much better and more energetic. This is especially true if your red blood cells have gotten low (anemic). The transfusion raises the level of the red blood cells which carry oxygen, and this usually causes an energy increase.  The nurse administering the transfusion will monitor you carefully for complications. HOME CARE INSTRUCTIONS  No special instructions are needed after a transfusion. You may find your energy is better. Speak with your caregiver about any limitations on activity for underlying diseases you may have. SEEK MEDICAL CARE IF:   Your condition is not improving after your transfusion.  You develop redness or irritation at the  intravenous (IV) site. SEEK IMMEDIATE MEDICAL CARE IF:  Any of the following symptoms occur over the next 12 hours:  Shaking chills.  You have a temperature by mouth above 102 F (38.9 C), not controlled by medicine.  Chest, back, or muscle pain.  People around you feel you are not acting correctly or are confused.  Shortness of breath or difficulty breathing.  Dizziness  and fainting.  You get a rash or develop hives.  You have a decrease in urine output.  Your urine turns a dark color or changes to pink, red, or brown. Any of the following symptoms occur over the next 10 days:  You have a temperature by mouth above 102 F (38.9 C), not controlled by medicine.  Shortness of breath.  Weakness after normal activity.  The white part of the eye turns yellow (jaundice).  You have a decrease in the amount of urine or are urinating less often.  Your urine turns a dark color or changes to pink, red, or brown. Document Released: 03/15/2000 Document Revised: 06/10/2011 Document Reviewed: 11/02/2007 [facility] Patient Information 2014 [place], [place].  _______________________________________________________________________

## 2019-06-25 ENCOUNTER — Encounter (HOSPITAL_COMMUNITY): Payer: Self-pay

## 2019-06-25 ENCOUNTER — Encounter (HOSPITAL_COMMUNITY)
Admission: RE | Admit: 2019-06-25 | Discharge: 2019-06-25 | Disposition: A | Discharge status: Home / Self Care | Payer: Medicare PPO | Source: Ambulatory Visit | Attending: Orthopedic Surgery | Admitting: Orthopedic Surgery

## 2019-06-25 ENCOUNTER — Other Ambulatory Visit: Payer: Self-pay

## 2019-06-25 DIAGNOSIS — Z01812 Encounter for preprocedural laboratory examination: Secondary | ICD-10-CM | POA: Insufficient documentation

## 2019-06-25 LAB — CBC
HCT: 39.1 % (ref 36.0–46.0)
Hemoglobin: 12.5 g/dL (ref 12.0–15.0)
MCH: 30.9 pg (ref 26.0–34.0)
MCHC: 32 g/dL (ref 30.0–36.0)
MCV: 96.5 fL (ref 80.0–100.0)
Platelets: 282 10*3/uL (ref 150–400)
RBC: 4.05 MIL/uL (ref 3.87–5.11)
RDW: 13.2 % (ref 11.5–15.5)
WBC: 8.6 10*3/uL (ref 4.0–10.5)
nRBC: 0 % (ref 0.0–0.2)

## 2019-06-25 LAB — COMPREHENSIVE METABOLIC PANEL
ALT: 18 U/L (ref 0–44)
AST: 19 U/L (ref 15–41)
Albumin: 4.4 g/dL (ref 3.5–5.0)
Alkaline Phosphatase: 77 U/L (ref 38–126)
Anion gap: 7 (ref 5–15)
BUN: 18 mg/dL (ref 8–23)
CO2: 27 mmol/L (ref 22–32)
Calcium: 9.4 mg/dL (ref 8.9–10.3)
Chloride: 106 mmol/L (ref 98–111)
Creatinine, Ser: 0.76 mg/dL (ref 0.44–1.00)
GFR calc Af Amer: >60 mL/min (ref >60)
GFR calc non Af Amer: >60 mL/min (ref >60)
Glucose, Bld: 101 mg/dL — ABNORMAL HIGH (ref 70–99)
Potassium: 4.9 mmol/L (ref 3.5–5.1)
Sodium: 140 mmol/L (ref 135–145)
Total Bilirubin: 0.7 mg/dL (ref 0.3–1.2)
Total Protein: 7.8 g/dL (ref 6.5–8.1)

## 2019-06-25 LAB — APTT: aPTT: 31 seconds (ref 24–36)

## 2019-06-25 LAB — ABO/RH: ABO/RH(D): O POS

## 2019-06-25 LAB — PROTIME-INR
INR: 1 (ref 0.8–1.2)
Prothrombin Time: 12.5 seconds (ref 11.4–15.2)

## 2019-06-25 NOTE — Progress Notes (Signed)
PCP - Dr. Patsey Berthold Cardiologist - no  Chest x-ray - no EKG - 06/02/19 Stress Test -no ECHO - no Cardiac Cath - no  Sleep Study - no CPAP -   Fasting Blood Sugar - NA Checks Blood Sugar _____ times a day  Blood Thinner Instructions:NA Aspirin Instructions: Last Dose:  Anesthesia review:   Patient denies shortness of breath, fever, cough and chest pain at PAT appointment yes  Patient verbalized understanding of instructions that were given to them at the PAT appointment. Patient was also instructed that they will need to review over the PAT instructions again at home before surgery. yes

## 2019-07-01 ENCOUNTER — Other Ambulatory Visit (HOSPITAL_COMMUNITY)
Admission: RE | Admit: 2019-07-01 | Discharge: 2019-07-01 | Disposition: A | Discharge status: Home / Self Care | Payer: Medicare PPO | Source: Ambulatory Visit | Attending: Orthopedic Surgery | Admitting: Orthopedic Surgery

## 2019-07-01 DIAGNOSIS — Z01812 Encounter for preprocedural laboratory examination: Secondary | ICD-10-CM | POA: Diagnosis present

## 2019-07-01 DIAGNOSIS — Z20822 Contact with and (suspected) exposure to covid-19: Secondary | ICD-10-CM | POA: Diagnosis not present

## 2019-07-01 LAB — SARS CORONAVIRUS 2 (TAT 6-24 HRS): SARS Coronavirus 2: NEGATIVE

## 2019-07-04 MED ORDER — BUPIVACAINE LIPOSOME 1.3 % IJ SUSP
20.0000 mL | Freq: Once | INTRAMUSCULAR | Status: DC
  Filled 2019-07-04: qty 20

## 2019-07-05 ENCOUNTER — Encounter (HOSPITAL_COMMUNITY)
Admission: RE | Disposition: A | Discharge status: Home / Self Care | Payer: Self-pay | Source: Home / Self Care | Attending: Orthopedic Surgery

## 2019-07-05 ENCOUNTER — Encounter (HOSPITAL_COMMUNITY): Payer: Self-pay | Admitting: Orthopedic Surgery

## 2019-07-05 ENCOUNTER — Telehealth (HOSPITAL_COMMUNITY): Payer: Self-pay | Admitting: *Deleted

## 2019-07-05 ENCOUNTER — Ambulatory Visit (HOSPITAL_COMMUNITY): Payer: Medicare PPO | Admitting: Anesthesiology

## 2019-07-05 ENCOUNTER — Ambulatory Visit (HOSPITAL_COMMUNITY)
Admission: RE | Admit: 2019-07-05 | Discharge: 2019-07-07 | Disposition: A | Discharge status: Home / Self Care | Payer: Medicare PPO | Attending: Orthopedic Surgery | Admitting: Orthopedic Surgery

## 2019-07-05 ENCOUNTER — Other Ambulatory Visit: Payer: Self-pay

## 2019-07-05 DIAGNOSIS — Z9849 Cataract extraction status, unspecified eye: Secondary | ICD-10-CM | POA: Diagnosis not present

## 2019-07-05 DIAGNOSIS — Z7982 Long term (current) use of aspirin: Secondary | ICD-10-CM | POA: Insufficient documentation

## 2019-07-05 DIAGNOSIS — M1710 Unilateral primary osteoarthritis, unspecified knee: Secondary | ICD-10-CM | POA: Diagnosis present

## 2019-07-05 DIAGNOSIS — Z79899 Other long term (current) drug therapy: Secondary | ICD-10-CM | POA: Insufficient documentation

## 2019-07-05 DIAGNOSIS — M1711 Unilateral primary osteoarthritis, right knee: Secondary | ICD-10-CM | POA: Diagnosis not present

## 2019-07-05 DIAGNOSIS — Z6832 Body mass index (BMI) 32.0-32.9, adult: Secondary | ICD-10-CM | POA: Insufficient documentation

## 2019-07-05 DIAGNOSIS — R42 Dizziness and giddiness: Secondary | ICD-10-CM | POA: Diagnosis not present

## 2019-07-05 DIAGNOSIS — M179 Osteoarthritis of knee, unspecified: Secondary | ICD-10-CM | POA: Diagnosis present

## 2019-07-05 DIAGNOSIS — I10 Essential (primary) hypertension: Secondary | ICD-10-CM | POA: Insufficient documentation

## 2019-07-05 DIAGNOSIS — Z881 Allergy status to other antibiotic agents status: Secondary | ICD-10-CM | POA: Diagnosis not present

## 2019-07-05 DIAGNOSIS — E669 Obesity, unspecified: Secondary | ICD-10-CM | POA: Diagnosis not present

## 2019-07-05 HISTORY — PX: TOTAL KNEE ARTHROPLASTY: SHX125

## 2019-07-05 LAB — TYPE AND SCREEN
ABO/RH(D): O POS
Antibody Screen: NEGATIVE

## 2019-07-05 SURGERY — ARTHROPLASTY, KNEE, TOTAL
Anesthesia: Spinal | Site: Knee | Laterality: Right

## 2019-07-05 MED ORDER — DEXAMETHASONE SODIUM PHOSPHATE 10 MG/ML IJ SOLN
8.0000 mg | Freq: Once | INTRAMUSCULAR | Status: AC
  Administered 2019-07-05: 8 mg via INTRAVENOUS

## 2019-07-05 MED ORDER — METOCLOPRAMIDE HCL 5 MG/ML IJ SOLN: 5.0000 mg | Freq: Three times a day (TID) | INTRAMUSCULAR | Status: DC | PRN

## 2019-07-05 MED ORDER — PHENYLEPHRINE HCL (PRESSORS) 10 MG/ML IV SOLN
INTRAVENOUS | Status: AC
  Filled 2019-07-05: qty 1

## 2019-07-05 MED ORDER — FENTANYL CITRATE (PF) 100 MCG/2ML IJ SOLN: 25.0000 ug | INTRAMUSCULAR | Status: DC | PRN

## 2019-07-05 MED ORDER — FLEET ENEMA 7-19 GM/118ML RE ENEM: 1.0000 | ENEMA | Freq: Once | RECTAL | Status: DC | PRN

## 2019-07-05 MED ORDER — CEFAZOLIN SODIUM-DEXTROSE 2-4 GM/100ML-% IV SOLN
2.0000 g | INTRAVENOUS | Status: AC
  Administered 2019-07-05: 2 g via INTRAVENOUS
  Filled 2019-07-05: qty 100

## 2019-07-05 MED ORDER — ONDANSETRON HCL 4 MG PO TABS: 4.0000 mg | ORAL_TABLET | Freq: Four times a day (QID) | ORAL | Status: DC | PRN

## 2019-07-05 MED ORDER — SODIUM CHLORIDE (PF) 0.9 % IJ SOLN
INTRAMUSCULAR | Status: AC
  Filled 2019-07-05: qty 50

## 2019-07-05 MED ORDER — SODIUM CHLORIDE 0.9 % IR SOLN
Status: DC | PRN
  Administered 2019-07-05: 2000 mL

## 2019-07-05 MED ORDER — SODIUM CHLORIDE (PF) 0.9 % IJ SOLN
INTRAMUSCULAR | Status: DC | PRN
  Administered 2019-07-05: 60 mL

## 2019-07-05 MED ORDER — RIVAROXABAN 10 MG PO TABS
10.0000 mg | ORAL_TABLET | Freq: Every day | ORAL | Status: DC
  Administered 2019-07-06 – 2019-07-07 (×2): 10 mg via ORAL
  Filled 2019-07-05 (×2): qty 1

## 2019-07-05 MED ORDER — GABAPENTIN 100 MG PO CAPS
200.0000 mg | ORAL_CAPSULE | Freq: Three times a day (TID) | ORAL | Status: DC
  Administered 2019-07-05 – 2019-07-07 (×5): 200 mg via ORAL
  Filled 2019-07-05 (×5): qty 2

## 2019-07-05 MED ORDER — METOCLOPRAMIDE HCL 5 MG PO TABS: 5.0000 mg | ORAL_TABLET | Freq: Three times a day (TID) | ORAL | Status: DC | PRN

## 2019-07-05 MED ORDER — ONDANSETRON HCL 4 MG/2ML IJ SOLN: 4.0000 mg | Freq: Four times a day (QID) | INTRAMUSCULAR | Status: DC | PRN

## 2019-07-05 MED ORDER — DEXAMETHASONE SODIUM PHOSPHATE 10 MG/ML IJ SOLN
10.0000 mg | Freq: Once | INTRAMUSCULAR | Status: DC
  Filled 2019-07-05: qty 1

## 2019-07-05 MED ORDER — ROPIVACAINE HCL 5 MG/ML IJ SOLN
INTRAMUSCULAR | Status: DC | PRN
  Administered 2019-07-05: 30 mL via PERINEURAL

## 2019-07-05 MED ORDER — POLYETHYLENE GLYCOL 3350 17 G PO PACK: 17.0000 g | PACK | Freq: Every day | ORAL | Status: DC | PRN

## 2019-07-05 MED ORDER — TRAMADOL HCL 50 MG PO TABS
50.0000 mg | ORAL_TABLET | Freq: Four times a day (QID) | ORAL | Status: DC | PRN
  Administered 2019-07-06: 50 mg via ORAL
  Filled 2019-07-05: qty 1

## 2019-07-05 MED ORDER — STERILE WATER FOR IRRIGATION IR SOLN
Status: DC | PRN
  Administered 2019-07-05: 1000 mL

## 2019-07-05 MED ORDER — SODIUM CHLORIDE (PF) 0.9 % IJ SOLN
INTRAMUSCULAR | Status: AC
  Filled 2019-07-05: qty 10

## 2019-07-05 MED ORDER — METHOCARBAMOL 500 MG PO TABS
500.0000 mg | ORAL_TABLET | Freq: Four times a day (QID) | ORAL | Status: DC | PRN
  Administered 2019-07-06 – 2019-07-07 (×2): 500 mg via ORAL
  Filled 2019-07-05 (×3): qty 1

## 2019-07-05 MED ORDER — PROPOFOL 500 MG/50ML IV EMUL
INTRAVENOUS | Status: DC | PRN
  Administered 2019-07-05: 75 ug/kg/min via INTRAVENOUS

## 2019-07-05 MED ORDER — TRANEXAMIC ACID-NACL 1000-0.7 MG/100ML-% IV SOLN
1000.0000 mg | INTRAVENOUS | Status: DC
  Filled 2019-07-05: qty 100

## 2019-07-05 MED ORDER — DOCUSATE SODIUM 100 MG PO CAPS
100.0000 mg | ORAL_CAPSULE | Freq: Two times a day (BID) | ORAL | Status: DC
  Administered 2019-07-05 – 2019-07-07 (×4): 100 mg via ORAL
  Filled 2019-07-05 (×4): qty 1

## 2019-07-05 MED ORDER — MORPHINE SULFATE (PF) 2 MG/ML IV SOLN
0.5000 mg | INTRAVENOUS | Status: DC | PRN
  Administered 2019-07-05: 0.5 mg via INTRAVENOUS
  Filled 2019-07-05: qty 1

## 2019-07-05 MED ORDER — ACETAMINOPHEN 10 MG/ML IV SOLN
1000.0000 mg | Freq: Four times a day (QID) | INTRAVENOUS | Status: DC
  Administered 2019-07-05: 14:00:00 1000 mg via INTRAVENOUS
  Filled 2019-07-05: qty 100

## 2019-07-05 MED ORDER — BISACODYL 10 MG RE SUPP: 10.0000 mg | Freq: Every day | RECTAL | Status: DC | PRN

## 2019-07-05 MED ORDER — SODIUM CHLORIDE 0.9 % IV SOLN: INTRAVENOUS | Status: DC

## 2019-07-05 MED ORDER — OXYCODONE HCL 5 MG PO TABS
5.0000 mg | ORAL_TABLET | ORAL | Status: DC | PRN
  Administered 2019-07-05: 10 mg via ORAL
  Administered 2019-07-06 – 2019-07-07 (×4): 5 mg via ORAL
  Filled 2019-07-05 (×2): qty 1
  Filled 2019-07-05: qty 2
  Filled 2019-07-05 (×3): qty 1

## 2019-07-05 MED ORDER — ONDANSETRON HCL 4 MG/2ML IJ SOLN
4.0000 mg | Freq: Four times a day (QID) | INTRAMUSCULAR | Status: DC | PRN
  Administered 2019-07-07: 4 mg via INTRAVENOUS
  Filled 2019-07-05: qty 2

## 2019-07-05 MED ORDER — ONDANSETRON HCL 4 MG/2ML IJ SOLN
INTRAMUSCULAR | Status: DC | PRN
  Administered 2019-07-05: 4 mg via INTRAVENOUS

## 2019-07-05 MED ORDER — DIPHENHYDRAMINE HCL 12.5 MG/5ML PO ELIX: 12.5000 mg | ORAL_SOLUTION | ORAL | Status: DC | PRN

## 2019-07-05 MED ORDER — CEFAZOLIN SODIUM-DEXTROSE 2-4 GM/100ML-% IV SOLN
2.0000 g | Freq: Four times a day (QID) | INTRAVENOUS | Status: AC
  Administered 2019-07-05 – 2019-07-06 (×2): 2 g via INTRAVENOUS
  Filled 2019-07-05 (×2): qty 100

## 2019-07-05 MED ORDER — LACTATED RINGERS IV SOLN: INTRAVENOUS | Status: DC

## 2019-07-05 MED ORDER — PROPOFOL 1000 MG/100ML IV EMUL
INTRAVENOUS | Status: AC
  Filled 2019-07-05: qty 100

## 2019-07-05 MED ORDER — POVIDONE-IODINE 10 % EX SWAB: 2.0000 "application " | Freq: Once | CUTANEOUS | Status: DC

## 2019-07-05 MED ORDER — BUPIVACAINE LIPOSOME 1.3 % IJ SUSP
INTRAMUSCULAR | Status: DC | PRN
  Administered 2019-07-05: 20 mL

## 2019-07-05 MED ORDER — METHOCARBAMOL 500 MG IVPB - SIMPLE MED
500.0000 mg | Freq: Four times a day (QID) | INTRAVENOUS | Status: DC | PRN
  Filled 2019-07-05 (×3): qty 50

## 2019-07-05 MED ORDER — PHENOL 1.4 % MT LIQD: 1.0000 | OROMUCOSAL | Status: DC | PRN

## 2019-07-05 MED ORDER — FENTANYL CITRATE (PF) 100 MCG/2ML IJ SOLN
50.0000 ug | INTRAMUSCULAR | Status: DC
  Administered 2019-07-05: 50 ug via INTRAVENOUS
  Filled 2019-07-05: qty 2

## 2019-07-05 MED ORDER — CHLORHEXIDINE GLUCONATE 4 % EX LIQD: 60.0000 mL | Freq: Once | CUTANEOUS | Status: DC

## 2019-07-05 MED ORDER — MIDAZOLAM HCL 2 MG/2ML IJ SOLN
1.0000 mg | INTRAMUSCULAR | Status: DC
  Filled 2019-07-05: qty 2

## 2019-07-05 MED ORDER — OXYCODONE HCL 5 MG/5ML PO SOLN: 5.0000 mg | Freq: Once | ORAL | Status: DC | PRN

## 2019-07-05 MED ORDER — PROPOFOL 500 MG/50ML IV EMUL
INTRAVENOUS | Status: DC | PRN
  Administered 2019-07-05 (×4): 20 mg via INTRAVENOUS

## 2019-07-05 MED ORDER — MENTHOL 3 MG MT LOZG: 1.0000 | LOZENGE | OROMUCOSAL | Status: DC | PRN

## 2019-07-05 MED ORDER — OXYCODONE HCL 5 MG PO TABS: 5.0000 mg | ORAL_TABLET | Freq: Once | ORAL | Status: DC | PRN

## 2019-07-05 MED ORDER — PHENYLEPHRINE HCL-NACL 10-0.9 MG/250ML-% IV SOLN
INTRAVENOUS | Status: DC | PRN
  Administered 2019-07-05: 50 ug/min via INTRAVENOUS

## 2019-07-05 MED ORDER — ACETAMINOPHEN 500 MG PO TABS
1000.0000 mg | ORAL_TABLET | Freq: Four times a day (QID) | ORAL | Status: AC
  Administered 2019-07-05 – 2019-07-06 (×4): 1000 mg via ORAL
  Filled 2019-07-05 (×4): qty 2

## 2019-07-05 SURGICAL SUPPLY — 57 items
BAG ZIPLOCK 12X15 (MISCELLANEOUS) ×2 IMPLANT
BLADE SAG 18X100X1.27 (BLADE) ×2 IMPLANT
BLADE SAW SGTL 11.0X1.19X90.0M (BLADE) ×2 IMPLANT
BLADE SURG SZ10 CARB STEEL (BLADE) ×4 IMPLANT
BNDG ELASTIC 6X15 VLCR STRL LF (GAUZE/BANDAGES/DRESSINGS) ×2 IMPLANT
BNDG ELASTIC 6X5.8 VLCR STR LF (GAUZE/BANDAGES/DRESSINGS) ×2 IMPLANT
BOWL SMART MIX CTS (DISPOSABLE) ×2 IMPLANT
CEMENT HV SMART SET (Cement) ×4 IMPLANT
CEMENT TIBIA MBT (Knees) ×1 IMPLANT
COVER SURGICAL LIGHT HANDLE (MISCELLANEOUS) ×2 IMPLANT
COVER WAND RF STERILE (DRAPES) IMPLANT
CUFF TOURN SGL QUICK 34 (TOURNIQUET CUFF) ×2
CUFF TRNQT CYL 34X4.125X (TOURNIQUET CUFF) ×1 IMPLANT
DECANTER SPIKE VIAL GLASS SM (MISCELLANEOUS) ×2 IMPLANT
DRAPE U-SHAPE 47X51 STRL (DRAPES) ×2 IMPLANT
DRSG AQUACEL AG ADV 3.5X10 (GAUZE/BANDAGES/DRESSINGS) ×2 IMPLANT
DURAPREP 26ML APPLICATOR (WOUND CARE) ×2 IMPLANT
ELECT REM PT RETURN 15FT ADLT (MISCELLANEOUS) ×2 IMPLANT
EVACUATOR 1/8 PVC DRAIN (DRAIN) IMPLANT
FEMUR SIGMA PS SZ 3.0 R (Femur) ×2 IMPLANT
GAUZE SPONGE 2X2 8PLY STRL LF (GAUZE/BANDAGES/DRESSINGS) ×1 IMPLANT
GLOVE BIO SURGEON STRL SZ7 (GLOVE) ×2 IMPLANT
GLOVE BIO SURGEON STRL SZ8 (GLOVE) ×2 IMPLANT
GLOVE BIOGEL PI IND STRL 7.0 (GLOVE) ×1 IMPLANT
GLOVE BIOGEL PI IND STRL 8 (GLOVE) ×1 IMPLANT
GLOVE BIOGEL PI INDICATOR 7.0 (GLOVE) ×1
GLOVE BIOGEL PI INDICATOR 8 (GLOVE) ×1
GOWN STRL REUS W/TWL LRG LVL3 (GOWN DISPOSABLE) ×4 IMPLANT
HANDPIECE INTERPULSE COAX TIP (DISPOSABLE) ×2
HOLDER FOLEY CATH W/STRAP (MISCELLANEOUS) IMPLANT
IMMOBILIZER KNEE 20 (SOFTGOODS) ×2
IMMOBILIZER KNEE 20 THIGH 36 (SOFTGOODS) ×1 IMPLANT
KIT TURNOVER KIT A (KITS) IMPLANT
MANIFOLD NEPTUNE II (INSTRUMENTS) ×2 IMPLANT
NS IRRIG 1000ML POUR BTL (IV SOLUTION) ×2 IMPLANT
PACK TOTAL KNEE CUSTOM (KITS) ×2 IMPLANT
PADDING CAST COTTON 6X4 STRL (CAST SUPPLIES) ×4 IMPLANT
PADDING CAST SYN 6 (CAST SUPPLIES) ×1
PADDING CAST SYNTHETIC 6X4 NS (CAST SUPPLIES) ×1 IMPLANT
PATELLA DOME PFC 35MM (Knees) ×2 IMPLANT
PENCIL SMOKE EVACUATOR (MISCELLANEOUS) IMPLANT
PIN STEINMAN FIXATION KNEE (PIN) ×2 IMPLANT
PLATE ROT INSERT 12.5MM SIZE 3 (Plate) ×2 IMPLANT
PROTECTOR NERVE ULNAR (MISCELLANEOUS) ×2 IMPLANT
SET HNDPC FAN SPRY TIP SCT (DISPOSABLE) ×1 IMPLANT
SPONGE GAUZE 2X2 STER 10/PKG (GAUZE/BANDAGES/DRESSINGS) ×1
STRIP CLOSURE SKIN 1/2X4 (GAUZE/BANDAGES/DRESSINGS) ×4 IMPLANT
SUT MNCRL AB 4-0 PS2 18 (SUTURE) ×2 IMPLANT
SUT STRATAFIX 0 PDS 27 VIOLET (SUTURE) ×2
SUT VIC AB 2-0 CT1 27 (SUTURE) ×6
SUT VIC AB 2-0 CT1 TAPERPNT 27 (SUTURE) ×3 IMPLANT
SUTURE STRATFX 0 PDS 27 VIOLET (SUTURE) ×1 IMPLANT
TIBIA MBT CEMENT (Knees) ×2 IMPLANT
TRAY FOLEY MTR SLVR 16FR STAT (SET/KITS/TRAYS/PACK) ×2 IMPLANT
WATER STERILE IRR 1000ML POUR (IV SOLUTION) ×4 IMPLANT
WRAP KNEE MAXI GEL POST OP (GAUZE/BANDAGES/DRESSINGS) ×2 IMPLANT
YANKAUER SUCT BULB TIP 10FT TU (MISCELLANEOUS) ×2 IMPLANT

## 2019-07-05 NOTE — Anesthesia Procedure Notes (Signed)
Date/Time: 07/05/2019 1:28 PM Performed by: Thornell Mule, CRNA Oxygen Delivery Method: Simple face mask

## 2019-07-05 NOTE — Discharge Instructions (Addendum)
Christine Arabian, MD Total Joint Specialist [facility] [address] [place], [place] [address] [phone]  TOTAL KNEE REPLACEMENT POSTOPERATIVE DIRECTIONS    Knee Rehabilitation, Guidelines Following Surgery  Results after knee surgery are often greatly improved when you follow the exercise, range of motion and muscle strengthening exercises prescribed by your doctor. Safety measures are also important to protect the knee from further injury. If any of these exercises cause you to have increased pain or swelling in your knee joint, decrease the amount until you are comfortable again and slowly increase them. If you have problems or questions, call your caregiver or physical therapist for advice.   BLOOD CLOT PREVENTION . Take a 10 mg Xarelto once a day for three weeks following surgery. Then take an 81 mg Aspirin once a day for three weeks. Then discontinue Aspirin. . You may resume your vitamins/supplements upon once you have discontinued the Xarelto. . Do not take any NSAIDs (Advil, Aleve, Ibuprofen, Meloxicam, etc.) until you have discontinued the Xarelto.   HOME CARE INSTRUCTIONS  . Remove items at home which could result in a fall. This includes throw rugs or furniture in walking pathways.  . ICE to the affected knee as much as tolerated. Icing helps control swelling. If the swelling is well controlled you will be more comfortable and rehab easier. Continue to use ice on the knee for pain and swelling from surgery. You may notice swelling that will progress down to the foot and ankle. This is normal after surgery. Elevate the leg when you are not up walking on it.    . Continue to use the breathing machine which will help keep your temperature down. It is common for your temperature to cycle up and down following surgery, especially at night when you are not up moving around and exerting yourself. The breathing machine keeps your lungs expanded and your  temperature down. . Do not place pillow under the operative knee, focus on keeping the knee straight while resting  DIET You may resume your previous home diet once you are discharged from the hospital.  DRESSING / [place] / SHOWERING . Keep your bulky bandage on for 2 days. On the third post-operative day you may remove the Ace bandage and gauze. There is a waterproof adhesive bandage on your skin which will stay in place until your first follow-up appointment. Once you remove this you will not need to place another bandage . You may begin showering 3 days following surgery, but do not submerge the incision under water.  ACTIVITY For the first 5 days, the key is rest and control of pain and swelling . Do your home exercises twice a day starting on post-operative day 3. On the days you go to physical therapy, just do the home exercises once that day. . You should rest, ice and elevate the leg for 50 minutes out of every hour. Get up and walk/stretch for 10 minutes per hour. After 5 days you can increase your activity slowly as tolerated. . Walk with your walker as instructed. Use the walker until you are comfortable transitioning to a cane. Walk with the cane in the opposite hand of the operative leg. You may discontinue the cane once you are comfortable and walking steadily. . Avoid periods of inactivity such as sitting longer than an hour when not asleep. This helps prevent blood clots.  . You may discontinue the knee immobilizer once you are able to perform a straight  leg raise while lying down. . You may resume a sexual relationship in one month or when given the OK by your doctor.  . You may return to work once you are cleared by your doctor.  . Do not drive a car for 6 weeks or until released by your surgeon.  . Do not drive while taking narcotics.  TED HOSE STOCKINGS Wear the elastic stockings on both legs for three weeks following surgery during the day. You may remove them at night  for sleeping.  WEIGHT BEARING Weight bearing as tolerated with assist device (walker, cane, etc) as directed, use it as long as suggested by your surgeon or therapist, typically at least 4-6 weeks.  POSTOPERATIVE CONSTIPATION PROTOCOL Constipation - defined medically as fewer than three stools per week and severe constipation as less than one stool per week.  One of the most common issues patients have following surgery is constipation.  Even if you have a regular bowel pattern at home, your normal regimen is likely to be disrupted due to multiple reasons following surgery.  Combination of anesthesia, postoperative narcotics, change in appetite and fluid intake all can affect your bowels.  In order to avoid complications following surgery, here are some recommendations in order to help you during your recovery period.  . Colace (docusate) - Pick up an over-the-counter form of Colace or another stool softener and take twice a day as long as you are requiring postoperative pain medications.  Take with a full glass of water daily.  If you experience loose stools or diarrhea, hold the colace until you stool forms back up. If your symptoms do not get better within 1 week or if they get worse, check with your doctor. . Dulcolax (bisacodyl) - Pick up over-the-counter and take as directed by the product packaging as needed to assist with the movement of your bowels.  Take with a full glass of water.  Use this product as needed if not relieved by Colace only.  . MiraLax (polyethylene glycol) - Pick up over-the-counter to have on hand. MiraLax is a solution that will increase the amount of water in your bowels to assist with bowel movements.  Take as directed and can mix with a glass of water, juice, soda, coffee, or tea. Take if you go more than two days without a movement. Do not use MiraLax more than once per day. Call your doctor if you are still constipated or irregular after using this medication for 7 days  in a row.  If you continue to have problems with postoperative constipation, please contact the office for further assistance and recommendations.  If you experience "the worst abdominal pain ever" or develop nausea or vomiting, please contact the office immediatly for further recommendations for treatment.  ITCHING If you experience itching with your medications, try taking only a single pain pill, or even half a pain pill at a time.  You can also use Benadryl over the counter for itching or also to help with sleep.   MEDICATIONS See your medication summary on the "After Visit Summary" that the nursing staff will review with you prior to discharge.  You may have some home medications which will be placed on hold until you complete the course of blood thinner medication.  It is important for you to complete the blood thinner medication as prescribed by your surgeon.  Continue your approved medications as instructed at time of discharge.  PRECAUTIONS . If you experience chest pain or shortness of   breath - call 911 immediately for transfer to the hospital emergency department.  . If you develop a fever greater that 101 F, purulent drainage from wound, increased redness or drainage from wound, foul odor from the wound/dressing, or calf pain - CONTACT YOUR SURGEON.                                                   FOLLOW-UP APPOINTMENTS Make sure you keep all of your appointments after your operation with your surgeon and caregivers. You should call the office at the above phone number and make an appointment for approximately two weeks after the date of your surgery or on the date instructed by your surgeon outlined in the "After Visit Summary".  RANGE OF MOTION AND STRENGTHENING EXERCISES  Rehabilitation of the knee is important following a knee injury or an operation. After just a few days of immobilization, the muscles of the thigh which control the knee become weakened and shrink (atrophy). Knee  exercises are designed to build up the tone and strength of the thigh muscles and to improve knee motion. Often times heat used for twenty to thirty minutes before working out will loosen up your tissues and help with improving the range of motion but do not use heat for the first two weeks following surgery. These exercises can be done on a training (exercise) mat, on the floor, on a table or on a bed. Use what ever works the best and is most comfortable for you Knee exercises include:  . Leg Lifts - While your knee is still immobilized in a splint or cast, you can do straight leg raises. Lift the leg to 60 degrees, hold for 3 sec, and slowly lower the leg. Repeat 10-20 times 2-3 times daily. Perform this exercise against resistance later as your knee gets better.  . Quad and Hamstring Sets - Tighten up the muscle on the front of the thigh (Quad) and hold for 5-10 sec. Repeat this 10-20 times hourly. Hamstring sets are done by pushing the foot backward against an object and holding for 5-10 sec. Repeat as with quad sets.   Leg Slides: Lying on your back, slowly slide your foot toward your buttocks, bending your knee up off the floor (only go as far as is comfortable). Then slowly slide your foot back down until your leg is flat on the floor again.  Angel Wings: Lying on your back spread your legs to the side as far apart as you can without causing discomfort.  A rehabilitation program following serious knee injuries can speed recovery and prevent re-injury in the future due to weakened muscles. Contact your doctor or a physical therapist for more information on knee rehabilitation.   IF YOU ARE TRANSFERRED TO A SKILLED REHAB FACILITY If the patient is transferred to a skilled rehab facility following release from the hospital, a list of the current medications will be sent to the facility for the patient to continue.  When discharged from the skilled rehab facility, please have the facility set up the  patient's Home Health Physical Therapy prior to being released. Also, the skilled facility will be responsible for providing the patient with their medications at time of release from the facility to include their pain medication, the muscle relaxants, and their blood thinner medication. If the patient is still at the   rehab facility at time of the two week follow up appointment, the skilled rehab facility will also need to assist the patient in arranging follow up appointment in our office and any transportation needs.  MAKE SURE YOU:  . Understand these instructions.  . Get help right away if you are not doing well or get worse.   DENTAL ANTIBIOTICS:  In most cases prophylactic antibiotics for Dental procdeures after total joint surgery are not necessary.  Exceptions are as follows:  1. History of prior total joint infection  2. Severely immunocompromised (Organ Transplant, cancer chemotherapy, Rheumatoid biologic meds such as Humera)  3. Poorly controlled diabetes (A1C &gt; 8.0, blood glucose over 200)  If you have one of these conditions, contact your surgeon for an antibiotic prescription, prior to your dental procedure.    Pick up stool softner and laxative for home use following surgery while on pain medications. Do not submerge incision under water. Please use good hand washing techniques while changing dressing each day. May shower starting three days after surgery. Please use a clean towel to pat the incision dry following showers. Continue to use ice for pain and swelling after surgery. Do not use any lotions or creams on the incision until instructed by your surgeon.   Information on my medicine - XARELTO (Rivaroxaban)   Why was Xarelto prescribed for you? Xarelto was prescribed for you to reduce the risk of blood clots forming after orthopedic surgery. The medical term for these abnormal blood clots is venous thromboembolism (VTE).  What do you need to know about  xarelto ? Take your Xarelto ONCE DAILY at the same time every day. You may take it either with or without food.  If you have difficulty swallowing the tablet whole, you may crush it and mix in applesauce just prior to taking your dose.  Take Xarelto exactly as prescribed by your doctor and DO NOT stop taking Xarelto without talking to the doctor who prescribed the medication.  Stopping without other VTE prevention medication to take the place of Xarelto may increase your risk of developing a clot.  After discharge, you should have regular check-up appointments with your healthcare provider that is prescribing your Xarelto.    What do you do if you miss a dose? If you miss a dose, take it as soon as you remember on the same day then continue your regularly scheduled once daily regimen the next day. Do not take two doses of Xarelto on the same day.   Important Safety Information A possible side effect of Xarelto is bleeding. You should call your healthcare provider right away if you experience any of the following: ? Bleeding from an injury or your nose that does not stop. ? Unusual colored urine (red or dark brown) or unusual colored stools (red or black). ? Unusual bruising for unknown reasons. ? A serious fall or if you hit your head (even if there is no bleeding).  Some medicines may interact with Xarelto and might increase your risk of bleeding while on Xarelto. To help avoid this, consult your healthcare provider or pharmacist prior to using any new prescription or non-prescription medications, including herbals, vitamins, non-steroidal anti-inflammatory drugs (NSAIDs) and supplements.  This website has more information on Xarelto: VisitDestination.com.br.

## 2019-07-05 NOTE — Evaluation (Signed)
Physical Therapy Evaluation Patient Details Name: Christine Mckay MRN: [id number] DOB: Oct 01, 1940 Today's Date: 07/05/2019   History of Present Illness  s/p R TKA. PMH: HTN  Clinical Impression  Pt is s/p TKA resulting in the deficits listed below (see PT Problem List).  Pt amb ~ 5' bed to chair, reported incr L foot numbness in standing therefore did not continue to amb greater distance.  Should continue to progress well in acute setting.   Pt will benefit from skilled PT to increase their independence and safety with mobility to allow discharge to the venue listed below.      Follow Up Recommendations Follow surgeon's recommendation for DC plan and follow-up therapies    Equipment Recommendations  Rolling walker with 5" wheels    Recommendations for Other Services       Precautions / Restrictions Precautions Precautions: Fall;Knee Restrictions Weight Bearing Restrictions: No Other Position/Activity Restrictions: WBAT      Mobility  Bed Mobility Overal bed mobility: Needs Assistance Bed Mobility: Supine to Sit     Supine to sit: Min assist     General bed mobility comments: assist with RLE, incr time effort  Transfers Overall transfer level: Needs assistance Equipment used: Rolling walker (2 wheeled) Transfers: Sit to/from Stand Sit to Stand: Min assist         General transfer comment: cues for hand placement and RLE position  Ambulation/Gait Ambulation/Gait assistance: Min assist Gait Distance (Feet): 5 Feet Assistive device: Rolling walker (2 wheeled) Gait Pattern/deviations: Step-to pattern;Decreased stance time - right     General Gait Details: cues for RW position and sequence. pt reported L foot numbness in standing therfore limited distance to a few steps forward and back to chair  Stairs            Wheelchair Mobility    Modified Rankin (Stroke Patients Only)       Balance                                              Pertinent Vitals/Pain Pain Assessment: 0-10 Pain Score: 3  Pain Location: right knee Pain Descriptors / Indicators: Aching;Grimacing;Sore Pain Intervention(s): Limited activity within patient's tolerance;Monitored during session;Repositioned    Home Living Family/patient expects to be discharged to:: Private residence Living Arrangements: Spouse/significant other Available Help at Discharge: Family Type of Home: House Home Access: Stairs to enter   Secretary/administrator of Steps: 3-4 with one rail Home Layout: Two level Home Equipment: Cane - single point Additional Comments: could sleep in recliner downstairs if needed    Prior Function Level of Independence: Independent         Comments: amb with cane occasionally     Hand Dominance        Extremity/Trunk Assessment   Upper Extremity Assessment Upper Extremity Assessment: Overall WFL for tasks assessed    Lower Extremity Assessment Lower Extremity Assessment: RLE deficits/detail;LLE deficits/detail RLE Deficits / Details: df at least to neutral (bil ankles inverted and in pf). knee extension and hip flexion 2+ to 3/5. AAROM knee flexion ~6 to 60 degrees LLE Deficits / Details: reported L foot numbness after standing       Communication   Communication: No difficulties  Cognition Arousal/Alertness: Awake/alert Behavior During Therapy: WFL for tasks assessed/performed Overall Cognitive Status: Within Functional Limits for tasks assessed  General Comments      Exercises Total Joint Exercises Ankle Circles/Pumps: AROM;Both;10 reps   Assessment/Plan    PT Assessment Patient needs continued PT services  PT Problem List Decreased strength;Decreased range of motion;Decreased activity tolerance;Decreased mobility;Decreased knowledge of use of DME       PT Treatment Interventions DME instruction;Therapeutic exercise;Gait training;Functional  mobility training;Therapeutic activities;Patient/family education;Stair training    PT Goals (Current goals can be found in the Care Plan section)  Acute Rehab PT Goals Patient Stated Goal: home PT Goal Formulation: With patient Time For Goal Achievement: 07/12/19 Potential to Achieve Goals: Good    Frequency 7X/week   Barriers to discharge        Co-evaluation               AM-PAC PT "6 Clicks" Mobility  Outcome Measure Help needed turning from your back to your side while in a flat bed without using bedrails?: A Little Help needed moving from lying on your back to sitting on the side of a flat bed without using bedrails?: A Little Help needed moving to and from a bed to a chair (including a wheelchair)?: A Little Help needed standing up from a chair using your arms (e.g., wheelchair or bedside chair)?: A Little Help needed to walk in hospital room?: A Little Help needed climbing 3-5 steps with a railing? : A Lot 6 Click Score: 17    End of Session Equipment Utilized During Treatment: Gait belt;Right knee immobilizer Activity Tolerance: Patient tolerated treatment well Patient left: in chair;with call bell/phone within reach;with chair alarm set;with nursing/sitter in room   PT Visit Diagnosis: Unsteadiness on feet (R26.81);Difficulty in walking, not elsewhere classified (R26.2)    Time: 1731-1801 PT Time Calculation (min) (ACUTE ONLY): 30 min   Charges:   PT Evaluation $PT Eval Low Complexity: 1 Low PT Treatments $Gait Training: 8-22 mins        Baxter Flattery, PT   Acute Rehab Dept [facility]): [phone]   07/05/2019   [facility] 07/05/2019, 6:23 PM

## 2019-07-05 NOTE — Progress Notes (Signed)
Orthopedic Tech Progress Note Patient Details:  Christine Mckay 09-28-1940 [id number] Patient removed from CPM prior to 19:40. Patient ID: [name], female   DOB: 1941-03-28, 79 y.o.   MRN: [id number]   Ancil Linsey 07/05/2019, 6:40 PM

## 2019-07-05 NOTE — Anesthesia Procedure Notes (Signed)
Anesthesia Regional Block: Adductor canal block   Pre-Anesthetic Checklist: ,, timeout performed, Correct Patient, Correct Site, Correct Laterality, Correct Procedure, Correct Position, site marked, Risks and benefits discussed,  Surgical consent,  Pre-op evaluation,  At surgeon's request and post-op pain management  Laterality: Right  Prep: chloraprep       Needles:  Injection technique: Single-shot  Needle Type: Echogenic Needle     Needle Length: 9cm  Needle Gauge: 21     Additional Needles:   Narrative:  Start time: 07/05/2019 12:35 PM End time: 07/05/2019 12:41 PM Injection made incrementally with aspirations every 5 mL.  Performed by: Personally  Anesthesiologist: Achille Rich, MD  Additional Notes: Pt tolerated the procedure well.

## 2019-07-05 NOTE — Op Note (Signed)
OPERATIVE REPORT-TOTAL KNEE ARTHROPLASTY   Pre-operative diagnosis- Osteoarthritis  Right knee(s)  Post-operative diagnosis- Osteoarthritis Right knee(s)  Procedure-  Right  Total Knee Arthroplasty  Surgeon- Dione Plover. Cienna Dumais, MD  Assistant- Theresa Duty, PA-C   Anesthesia-  Adductor canal block and spinal  EBL-25 ml   Drains Hemovac  Tourniquet time-  Total Tourniquet Time Documented: Thigh (Right) - 39 minutes Total: Thigh (Right) - 39 minutes     Complications- None  Condition-PACU - hemodynamically stable.   Brief Clinical Note  Christine Mckay is a 79 y.o. year old female with end stage OA of her right knee with progressively worsening pain and dysfunction. She has constant pain, with activity and at rest and significant functional deficits with difficulties even with ADLs. She has had extensive non-op management including analgesics, injections of cortisone and viscosupplements, and home exercise program, but remains in significant pain with significant dysfunction.Radiographs show bone on bone arthritis lateral and patellofemoral. She presents now for right Total Knee Arthroplasty.    Procedure in detail---   The patient is brought into the operating room and positioned supine on the operating table. After successful administration of  Adductor canal block and spinal,   a tourniquet is placed high on the  Right thigh(s) and the lower extremity is prepped and draped in the usual sterile fashion. Time out is performed by the operating team and then the  Right lower extremity is wrapped in Esmarch, knee flexed and the tourniquet inflated to 300 mmHg.       A midline incision is made with a ten blade through the subcutaneous tissue to the level of the extensor mechanism. A fresh blade is used to make a medial parapatellar arthrotomy. Soft tissue over the proximal medial tibia is subperiosteally elevated to the joint line with a knife and into the semimembranosus bursa with  a Cobb elevator. Soft tissue over the proximal lateral tibia is elevated with attention being paid to avoiding the patellar tendon on the tibial tubercle. The patella is everted, knee flexed 90 degrees and the ACL and PCL are removed. Findings are bone on bone lateral and patellofemoral with large global osteophytes        The drill is used to create a starting hole in the distal femur and the canal is thoroughly irrigated with sterile saline to remove the fatty contents. The 5 degree Right  valgus alignment guide is placed into the femoral canal and the distal femoral cutting block is pinned to remove 10 mm off the distal femur. Resection is made with an oscillating saw.      The tibia is subluxed forward and the menisci are removed. The extramedullary alignment guide is placed referencing proximally at the medial aspect of the tibial tubercle and distally along the second metatarsal axis and tibial crest. The block is pinned to remove 42mm off the more deficient lateral  side. Resection is made with an oscillating saw. Size 3 is the most appropriate size for the tibia and the proximal tibia is prepared with the modular drill and keel punch for that size.      The femoral sizing guide is placed and size 3 is most appropriate. Rotation is marked off the epicondylar axis and confirmed by creating a rectangular flexion gap at 90 degrees. The size 3 cutting block is pinned in this rotation and the anterior, posterior and chamfer cuts are made with the oscillating saw. The intercondylar block is then placed and that cut is made.  Trial size 3 tibial component, trial size 3 posterior stabilized femur and a 12.5  mm posterior stabilized rotating platform insert trial is placed. Full extension is achieved with excellent varus/valgus and anterior/posterior balance throughout full range of motion. The patella is everted and thickness measured to be 22  mm. Free hand resection is taken to 12 mm, a 35 template is  placed, lug holes are drilled, trial patella is placed, and it tracks normally. Osteophytes are removed off the posterior femur with the trial in place. All trials are removed and the cut bone surfaces prepared with pulsatile lavage. Cement is mixed and once ready for implantation, the size 3 tibial implant, size  3 posterior stabilized femoral component, and the size 35 patella are cemented in place and the patella is held with the clamp. The trial insert is placed and the knee held in full extension. The Exparel (20 ml mixed with 60 ml saline) is injected into the extensor mechanism, posterior capsule, medial and lateral gutters and subcutaneous tissues.  All extruded cement is removed and once the cement is hard the permanent 12.5 mm posterior stabilized rotating platform insert is placed into the tibial tray.      The wound is copiously irrigated with saline solution and the extensor mechanism closed over a hemovac drain with #1 V-loc suture. The tourniquet is released for a total tourniquet time of 39  minutes. Flexion against gravity is 140 degrees and the patella tracks normally. Subcutaneous tissue is closed with 2.0 vicryl and subcuticular with running 4.0 Monocryl. The incision is cleaned and dried and steri-strips and a bulky sterile dressing are applied. The limb is placed into a knee immobilizer and the patient is awakened and transported to recovery in stable condition.      Please note that a surgical assistant was a medical necessity for this procedure in order to perform it in a safe and expeditious manner. Surgical assistant was necessary to retract the ligaments and vital neurovascular structures to prevent injury to them and also necessary for proper positioning of the limb to allow for anatomic placement of the prosthesis.   Gus Rankin Earnstine Meinders, MD    07/05/2019, 3:04 PM

## 2019-07-05 NOTE — Interval H&P Note (Signed)
History and Physical Interval Note:  07/05/2019 11:24 AM  Christine Mckay  has presented today for surgery, with the diagnosis of right knee osteoarthritis.  The various methods of treatment have been discussed with the patient and family. After consideration of risks, benefits and other options for treatment, the patient has consented to  Procedure(s) with comments: TOTAL KNEE ARTHROPLASTY (Right) - as a surgical intervention.  The patient's history has been reviewed, patient examined, no change in status, stable for surgery.  I have reviewed the patient's chart and labs.  Questions were answered to the patient's satisfaction.     Homero Fellers Amery Minasyan

## 2019-07-05 NOTE — Transfer of Care (Signed)
Immediate Anesthesia Transfer of Care Note  Patient: Christine Mckay  Procedure(s) Performed: TOTAL KNEE ARTHROPLASTY (Right Knee)  Patient Location: PACU  Anesthesia Type:MAC and Spinal  Level of Consciousness: awake, alert  and oriented  Airway & Oxygen Therapy: Patient Spontanous Breathing and Patient connected to face mask oxygen  Post-op Assessment: Report given to RN and Post -op Vital signs reviewed and stable  Post vital signs: Reviewed and stable  Last Vitals:  Vitals Value Taken Time  BP 130/50 07/05/19 1520  Temp    Pulse 57 07/05/19 1521  Resp 13 07/05/19 1521  SpO2 100 % 07/05/19 1521  Vitals shown include unvalidated device data.  Last Pain:  Vitals:   07/05/19 1112  TempSrc:   PainSc: 0-No pain         Complications: No apparent anesthesia complications

## 2019-07-05 NOTE — Anesthesia Preprocedure Evaluation (Addendum)
Anesthesia Evaluation  Patient identified by MRN, date of birth, ID band Patient awake    Reviewed: Allergy & Precautions, H&P , NPO status , Patient's Chart, lab work & pertinent test results  Airway Mallampati: II   Neck ROM: full    Dental   Pulmonary neg pulmonary ROS,    breath sounds clear to auscultation       Cardiovascular hypertension,  Rhythm:regular Rate:Normal     Neuro/Psych    GI/Hepatic   Endo/Other    Renal/GU      Musculoskeletal  (+) Arthritis ,   Abdominal   Peds  Hematology   Anesthesia Other Findings   Reproductive/Obstetrics                             Anesthesia Physical Anesthesia Plan  ASA: II  Anesthesia Plan: Spinal   Post-op Pain Management:  Regional for Post-op pain   Induction: Intravenous  PONV Risk Score and Plan: 2 and Propofol infusion, Ondansetron and Treatment may vary due to age or medical condition  Airway Management Planned: Simple Face Mask  Additional Equipment:   Intra-op Plan:   Post-operative Plan:   Informed Consent: I have reviewed the patients History and Physical, chart, labs and discussed the procedure including the risks, benefits and alternatives for the proposed anesthesia with the patient or authorized representative who has indicated his/her understanding and acceptance.       Plan Discussed with: CRNA, Anesthesiologist and Surgeon  Anesthesia Plan Comments:         Anesthesia Quick Evaluation

## 2019-07-05 NOTE — Progress Notes (Signed)
Assisted Dr. Hodierne with right, ultrasound guided, adductor canal block. Side rails up, monitors on throughout procedure. See vital signs in flow sheet. Tolerated Procedure well.  

## 2019-07-06 ENCOUNTER — Encounter: Payer: Self-pay | Admitting: *Deleted

## 2019-07-06 DIAGNOSIS — M1711 Unilateral primary osteoarthritis, right knee: Secondary | ICD-10-CM | POA: Diagnosis not present

## 2019-07-06 LAB — BASIC METABOLIC PANEL
Anion gap: 8 (ref 5–15)
Anion gap: 9 (ref 5–15)
BUN: 17 mg/dL (ref 8–23)
BUN: 16 mg/dL (ref 8–23)
CO2: 26 mmol/L (ref 22–32)
CO2: 26 mmol/L (ref 22–32)
Calcium: 8.8 mg/dL — ABNORMAL LOW (ref 8.9–10.3)
Calcium: 9.2 mg/dL (ref 8.9–10.3)
Chloride: 100 mmol/L (ref 98–111)
Chloride: 100 mmol/L (ref 98–111)
Creatinine, Ser: 0.75 mg/dL (ref 0.44–1.00)
Creatinine, Ser: 0.69 mg/dL (ref 0.44–1.00)
GFR calc Af Amer: >60 mL/min (ref >60)
GFR calc Af Amer: >60 mL/min (ref >60)
GFR calc non Af Amer: >60 mL/min (ref >60)
GFR calc non Af Amer: >60 mL/min (ref >60)
Glucose, Bld: 146 mg/dL — ABNORMAL HIGH (ref 70–99)
Glucose, Bld: 116 mg/dL — ABNORMAL HIGH (ref 70–99)
Potassium: 5.5 mmol/L — ABNORMAL HIGH (ref 3.5–5.1)
Potassium: 4.5 mmol/L (ref 3.5–5.1)
Sodium: 134 mmol/L — ABNORMAL LOW (ref 135–145)
Sodium: 135 mmol/L (ref 135–145)

## 2019-07-06 LAB — CBC
HCT: 34.5 % — ABNORMAL LOW (ref 36.0–46.0)
Hemoglobin: 10.9 g/dL — ABNORMAL LOW (ref 12.0–15.0)
MCH: 30.3 pg (ref 26.0–34.0)
MCHC: 31.6 g/dL (ref 30.0–36.0)
MCV: 95.8 fL (ref 80.0–100.0)
Platelets: 253 10*3/uL (ref 150–400)
RBC: 3.6 MIL/uL — ABNORMAL LOW (ref 3.87–5.11)
RDW: 12.8 % (ref 11.5–15.5)
WBC: 14.1 10*3/uL — ABNORMAL HIGH (ref 4.0–10.5)
nRBC: 0 % (ref 0.0–0.2)

## 2019-07-06 MED ORDER — GABAPENTIN 100 MG PO CAPS: 200.0000 mg | ORAL_CAPSULE | Freq: Three times a day (TID) | ORAL | 0 refills | Status: AC

## 2019-07-06 MED ORDER — TRAMADOL HCL 50 MG PO TABS: 50.0000 mg | ORAL_TABLET | Freq: Four times a day (QID) | ORAL | 0 refills | Status: AC | PRN

## 2019-07-06 MED ORDER — OXYCODONE HCL 5 MG PO TABS: 5.0000 mg | ORAL_TABLET | Freq: Four times a day (QID) | ORAL | 0 refills | Status: DC | PRN

## 2019-07-06 MED ORDER — RIVAROXABAN 10 MG PO TABS: 10.0000 mg | ORAL_TABLET | Freq: Every day | ORAL | 0 refills | Status: AC

## 2019-07-06 MED ORDER — METHOCARBAMOL 500 MG PO TABS: 500.0000 mg | ORAL_TABLET | Freq: Four times a day (QID) | ORAL | 0 refills | Status: AC | PRN

## 2019-07-06 NOTE — TOC Transition Note (Signed)
Transition of Care [facility]) - CM/SW Discharge Note   Patient Details  Name: Christine Mckay MRN: [id number] Date of Birth: 09/06/1940  Transition of Care [facility]) CM/SW Contact:  Amada Jupiter, LCSW Phone Number: 07/06/2019, 10:21 AM   Clinical Narrative:   Have confirmed with pt that she is in need or rw and 3n1 - referral to [facility].  Has OPPT already arranged. Anticipate d/c today. No further needs identified.    Final next level of care: OP Rehab Barriers to Discharge: Barriers Resolved   Patient Goals and CMS Choice Patient states their goals for this hospitalization and ongoing recovery are:: hopes to go home today   Choice offered to / list presented to : Patient  Discharge Placement                       Discharge Plan and Services                DME Arranged: 3-N-1, Walker rolling DME Agency: [facility] Date DME Agency Contacted: 07/06/19 Time DME Agency Contacted: 1020 Representative spoke with at DME Agency: Oletha Cruel HH Arranged: (OPPT)          Social Determinants of Health (SDOH) Interventions     Readmission Risk Interventions No flowsheet data found.

## 2019-07-06 NOTE — Progress Notes (Addendum)
   Subjective: 1 Day Post-Op Procedure(s) (LRB): TOTAL KNEE ARTHROPLASTY (Right) Patient reports pain as mild.   Patient seen in rounds by Dr. Lequita Halt. Patient is well, and has had no acute complaints or problems other than pain in the right knee. Denies chest pain, SOB, or calf pain. No issues overnight. Foley catheter to be removed this AM. We will continue therapy today.   Objective: Vital signs in last 24 hours: Temp:  [97.4 F (36.3 C)-99.7 F (37.6 C)] 97.4 F (36.3 C) (04/06 0535) Pulse Rate:  [56-81] 62 (04/06 0535) Resp:  [10-27] 18 (04/06 0535) BP: (125-196)/(50-93) 138/74 (04/06 0535) SpO2:  [97 %-100 %] 99 % (04/06 0535) Weight:  [91.2 kg] 91.2 kg (04/05 1112)  Intake/Output from previous day:  Intake/Output Summary (Last 24 hours) at 07/06/2019 0717 Last data filed at 07/06/2019 0509 Gross per 24 hour  Intake 3336.14 ml  Output 855 ml  Net 2481.14 ml     Intake/Output this shift: No intake/output data recorded.  Labs: Recent Labs    07/06/19 0320  HGB 10.9*   Recent Labs    07/06/19 0320  WBC 14.1*  RBC 3.60*  HCT 34.5*  PLT 253   Recent Labs    07/06/19 0320  NA 134*  K 5.5*  CL 100  CO2 26  BUN 17  CREATININE 0.75  GLUCOSE 146*  CALCIUM 8.8*   No results for input(s): LABPT, INR in the last 72 hours.  Exam: General - Patient is Alert and Oriented Extremity - Neurologically intact Neurovascular intact Sensation intact distally Dorsiflexion/Plantar flexion intact Dressing - dressing C/D/I Motor Function - intact, moving foot and toes well on exam.   Past Medical History:  Diagnosis Date  . Hypertension   . Vertigo     Assessment/Plan: 1 Day Post-Op Procedure(s) (LRB): TOTAL KNEE ARTHROPLASTY (Right) Principal Problem:   OA (osteoarthritis) of knee Active Problems:   Primary osteoarthritis of right knee  Estimated body mass index is 32.45 kg/m as calculated from the following:   Height as of this encounter: 5\' 6"  (1.676  m).   Weight as of this encounter: 91.2 kg. Advance diet Up with therapy D/C IV fluids   Patient's anticipated LOS is less than 2 midnights, meeting these requirements: - Younger than 55 - Lives within 1 hour of care - Has a competent adult at home to recover with post-op recover - NO history of  - Chronic pain requiring opiods  - Diabetes  - Coronary Artery Disease  - Heart failure  - Heart attack  - Stroke  - DVT/VTE  - Cardiac arrhythmia  - Respiratory Failure/COPD  - Renal failure  - Anemia  - Advanced Liver disease  DVT Prophylaxis - Xarelto Weight bearing as tolerated. D/C O2 and pulse ox and try on room air. Hemovac pulled without difficulty, will continue therapy today.  Potassium 5.5 this AM. Will recheck with repeat BMP to rule out hemolysis.   Plan is to go Home after hospital stay. Plan for discharge later today if progresses with therapy and meeting goals. Scheduled for outpatient physical therapy at the [facility].  Follow-up in the office in 2 weeks.   ADDENDUM: Repeat potassium 4.5. No further action needed.  [facility], PA-C Orthopedic Surgery [phone] 07/06/2019, 7:17 AM

## 2019-07-06 NOTE — Progress Notes (Signed)
Pt has had slower progression with physical therapy, is not yet meeting goals to be cleared for discharge. Will stay overnight and continue working with therapy tomorrow.   Jaeliana Lococo, PA-C Orthopedic Surgery [facility] [place]   

## 2019-07-06 NOTE — Progress Notes (Signed)
Physical Therapy Treatment Patient Details Name: Christine Mckay MRN: [id number] DOB: 21-Jan-1941 Today's Date: 07/06/2019    History of Present Illness s/p R TKA. PMH: HTN    PT Comments    Pt progressing this pm, gait distance/tolerance improving however feel pt will benefit from another day of PT for safe d/c home. Discussed with RN as well as pt and husband   Follow Up Recommendations  Follow surgeon's recommendation for DC plan and follow-up therapies     Equipment Recommendations  Rolling walker with 5" wheels    Recommendations for Other Services       Precautions / Restrictions Precautions Precautions: Fall;Knee Required Braces or Orthoses: Knee Immobilizer - Right Knee Immobilizer - Right: Discontinue once straight leg raise with < 10 degree lag Restrictions Weight Bearing Restrictions: No Other Position/Activity Restrictions: WBAT    Mobility  Bed Mobility               General bed mobility comments: NT--in chair   Transfers Overall transfer level: Needs assistance Equipment used: Rolling walker (2 wheeled) Transfers: Sit to/from Stand Sit to Stand: Min assist;Mod assist         General transfer comment: cues for hand placement and RLE position, posterior LOB on initial standing, mod assist to recover  Ambulation/Gait Ambulation/Gait assistance: Min assist Gait Distance (Feet): 40 Feet Assistive device: Rolling walker (2 wheeled) Gait Pattern/deviations: Step-to pattern;Decreased step length - left;Decreased stance time - right;Decreased weight shift to right;Wide base of support     General Gait Details: cues for sequence, trunk extension, to incr step length and decr BOS   Stairs             Wheelchair Mobility    Modified Rankin (Stroke Patients Only)       Balance                                            Cognition Arousal/Alertness: Awake/alert Behavior During Therapy: WFL for tasks  assessed/performed Overall Cognitive Status: Within Functional Limits for tasks assessed                                        Exercises Total Joint Exercises Ankle Circles/Pumps: AROM;Both;10 reps Quad Sets: AROM;Both;10 reps Heel Slides: AAROM;Right;10 reps Hip ABduction/ADduction: AROM;AAROM;Right;10 reps Straight Leg Raises: AAROM;Right;10 reps Goniometric ROM: ~ -10 to 75 degress AAROM right knee flexion    General Comments        Pertinent Vitals/Pain Pain Assessment: 0-10 Pain Score: 4  Pain Location: right knee Pain Descriptors / Indicators: Aching;Burning Pain Intervention(s): Limited activity within patient's tolerance;Monitored during session;Premedicated before session;Repositioned;Ice applied    Home Living                      Prior Function            PT Goals (current goals can now be found in the care plan section) Acute Rehab PT Goals Patient Stated Goal: home PT Goal Formulation: With patient Time For Goal Achievement: 07/12/19 Potential to Achieve Goals: Good    Frequency    7X/week      PT Plan Current plan remains appropriate    Co-evaluation              AM-PAC  PT "6 Clicks" Mobility   Outcome Measure  Help needed turning from your back to your side while in a flat bed without using bedrails?: A Little Help needed moving from lying on your back to sitting on the side of a flat bed without using bedrails?: A Little Help needed moving to and from a bed to a chair (including a wheelchair)?: A Little Help needed standing up from a chair using your arms (e.g., wheelchair or bedside chair)?: A Little Help needed to walk in hospital room?: A Little Help needed climbing 3-5 steps with a railing? : A Lot 6 Click Score: 17    End of Session Equipment Utilized During Treatment: Gait belt;Right knee immobilizer Activity Tolerance: Patient tolerated treatment well Patient left: in chair;with call bell/phone  within reach;with chair alarm set;with nursing/sitter in room Nurse Communication: Mobility status PT Visit Diagnosis: Unsteadiness on feet (R26.81);Difficulty in walking, not elsewhere classified (R26.2)     Time: 3383-2919 PT Time Calculation (min) (ACUTE ONLY): 25 min  Charges:  $Gait Training: 8-22 mins $Therapeutic Exercise: 8-22 mins                     Delice Bison, PT   Acute Rehab Dept [facility]): [phone]   07/06/2019    [facility] 07/06/2019, 3:15 PM

## 2019-07-06 NOTE — Progress Notes (Signed)
Physical Therapy Treatment Patient Details Name: Christine Mckay MRN: [id number] DOB: 1940-05-01 Today's Date: 07/06/2019    History of Present Illness s/p R TKA. PMH: HTN    PT Comments    Pt with LOB on initial standing requiring mod assist to recover, had similar episode with nursing staff earlier. Pt does endorse falls at home. amb with extremely wide BOS to compensate for diminished balance. Will see how pt does this pm, however concerned for potential falls at home at this time  Pt is able to df bil, denies numbness today, however positions bil feet in end range df and inversion at rest.    Follow Up Recommendations  Follow surgeon's recommendation for DC plan and follow-up therapies     Equipment Recommendations  Rolling walker with 5" wheels    Recommendations for Other Services       Precautions / Restrictions Precautions Precautions: Fall;Knee Required Braces or Orthoses: Knee Immobilizer - Right Knee Immobilizer - Right: Discontinue once straight leg raise with < 10 degree lag Restrictions Weight Bearing Restrictions: No Other Position/Activity Restrictions: WBAT    Mobility  Bed Mobility               General bed mobility comments: NT--in chair   Transfers Overall transfer level: Needs assistance Equipment used: Rolling walker (2 wheeled) Transfers: Sit to/from Stand Sit to Stand: Min assist;Mod assist         General transfer comment: cues for hand placement and RLE position, posterior LOB on initial standing, mod assist to recover  Ambulation/Gait Ambulation/Gait assistance: Min assist Gait Distance (Feet): 30 Feet(15') Assistive device: Rolling walker (2 wheeled) Gait Pattern/deviations: Step-to pattern;Decreased step length - left;Decreased stance time - right;Decreased weight shift to right;Wide base of support     General Gait Details: cues for sequence, trunk extension. pt with very unsteady gait requiring min assist for balance throughout     Stairs             Wheelchair Mobility    Modified Rankin (Stroke Patients Only)       Balance                                            Cognition Arousal/Alertness: Awake/alert Behavior During Therapy: WFL for tasks assessed/performed Overall Cognitive Status: Within Functional Limits for tasks assessed                                        Exercises Total Joint Exercises Ankle Circles/Pumps: AROM;Both;10 reps    General Comments        Pertinent Vitals/Pain Pain Assessment: 0-10 Pain Score: 3  Pain Location: right knee Pain Descriptors / Indicators: Aching;Burning Pain Intervention(s): Limited activity within patient's tolerance;Monitored during session;Premedicated before session;Repositioned;Ice applied    Home Living                      Prior Function            PT Goals (current goals can now be found in the care plan section) Acute Rehab PT Goals Patient Stated Goal: home PT Goal Formulation: With patient Time For Goal Achievement: 07/12/19 Potential to Achieve Goals: Good Progress towards PT goals: Progressing toward goals    Frequency    7X/week  PT Plan Current plan remains appropriate    Co-evaluation              AM-PAC PT "6 Clicks" Mobility   Outcome Measure  Help needed turning from your back to your side while in a flat bed without using bedrails?: A Little Help needed moving from lying on your back to sitting on the side of a flat bed without using bedrails?: A Little Help needed moving to and from a bed to a chair (including a wheelchair)?: A Little Help needed standing up from a chair using your arms (e.g., wheelchair or bedside chair)?: A Little Help needed to walk in hospital room?: A Little Help needed climbing 3-5 steps with a railing? : A Lot 6 Click Score: 17    End of Session Equipment Utilized During Treatment: Gait belt;Right knee  immobilizer Activity Tolerance: Patient tolerated treatment well Patient left: in chair;with call bell/phone within reach;with chair alarm set;with nursing/sitter in room Nurse Communication: Mobility status PT Visit Diagnosis: Unsteadiness on feet (R26.81);Difficulty in walking, not elsewhere classified (R26.2)     Time: 5188-4166 PT Time Calculation (min) (ACUTE ONLY): 19 min  Charges:  $Gait Training: 8-22 mins                     Baxter Flattery, PT   Acute Rehab Dept [facility]): [phone]   07/06/2019    [facility] 07/06/2019, 10:49 AM

## 2019-07-07 DIAGNOSIS — M1711 Unilateral primary osteoarthritis, right knee: Secondary | ICD-10-CM | POA: Diagnosis not present

## 2019-07-07 LAB — CBC
HCT: 34 % — ABNORMAL LOW (ref 36.0–46.0)
Hemoglobin: 10.6 g/dL — ABNORMAL LOW (ref 12.0–15.0)
MCH: 30.1 pg (ref 26.0–34.0)
MCHC: 31.2 g/dL (ref 30.0–36.0)
MCV: 96.6 fL (ref 80.0–100.0)
Platelets: 238 10*3/uL (ref 150–400)
RBC: 3.52 MIL/uL — ABNORMAL LOW (ref 3.87–5.11)
RDW: 13.1 % (ref 11.5–15.5)
WBC: 14.1 10*3/uL — ABNORMAL HIGH (ref 4.0–10.5)
nRBC: 0 % (ref 0.0–0.2)

## 2019-07-07 LAB — BASIC METABOLIC PANEL
Anion gap: 6 (ref 5–15)
BUN: 18 mg/dL (ref 8–23)
CO2: 28 mmol/L (ref 22–32)
Calcium: 9 mg/dL (ref 8.9–10.3)
Chloride: 98 mmol/L (ref 98–111)
Creatinine, Ser: 0.59 mg/dL (ref 0.44–1.00)
GFR calc Af Amer: >60 mL/min (ref >60)
GFR calc non Af Amer: >60 mL/min (ref >60)
Glucose, Bld: 124 mg/dL — ABNORMAL HIGH (ref 70–99)
Potassium: 4.6 mmol/L (ref 3.5–5.1)
Sodium: 132 mmol/L — ABNORMAL LOW (ref 135–145)

## 2019-07-07 NOTE — Plan of Care (Signed)
resolved 

## 2019-07-07 NOTE — Progress Notes (Signed)
07/07/19 1400  PT Visit Information  Last PT Received On 07/07/19  Pt continues to experience LOB posteriorly on initial standing, husband and pt confirm that this happened at home prior to surgery. Discussed pt needing assist with mobility/sit<>stand transfers for safety and fall prevention. Husband assisted pt frequently prior to this admission.  Reviewed stairs this pm, pt husband able to provide min assist on stairs. Ok to d/c (with husband assist) from PT standpoint  Assistance Needed +1  History of Present Illness s/p R TKA. PMH: HTN  Subjective Data  Patient Stated Goal home  Precautions  Precautions Fall;Knee  Required Braces or Orthoses Knee Immobilizer - Right  Knee Immobilizer - Right Discontinue once straight leg raise with < 10 degree lag  Restrictions  Weight Bearing Restrictions No  Other Position/Activity Restrictions WBAT  Pain Assessment  Pain Assessment 0-10  Pain Score 4  Pain Location right knee  Pain Descriptors / Indicators Aching;Burning  Pain Intervention(s) Limited activity within patient's tolerance;Monitored during session;Repositioned  Cognition  Arousal/Alertness Awake/alert  Behavior During Therapy WFL for tasks assessed/performed  Overall Cognitive Status Within Functional Limits for tasks assessed  Bed Mobility  General bed mobility comments in recliner on arrival   Transfers  Overall transfer level Needs assistance  Equipment used Rolling walker (2 wheeled)  Transfers Sit to/from Stand  Sit to Stand Min assist  General transfer comment cues for hand placement and RLE position, posterior LOB on initial standing, mod assist to recover  Ambulation/Gait  Ambulation/Gait assistance Min assist  Gait Distance (Feet) 35 Feet  Assistive device Rolling walker (2 wheeled)  Gait Pattern/deviations Step-to pattern;Decreased step length - left;Decreased stance time - right;Decreased weight shift to right;Wide base of support  General Gait Details cues  for sequence, trunk extension, to incr step length and decr BOS  Stairs Yes  Stairs assistance Min assist  Stair Management One rail Right;With cane;Forwards  Number of Stairs 3 (up 2 down 3 )  General stair comments cues for sequence and safe technique. min assist to balance and wt shift   Balance  Overall balance assessment Needs assistance  Sitting-balance support No upper extremity supported;Feet supported  Sitting balance-Leahy Scale Fair  Standing balance support Bilateral upper extremity supported;During functional activity  Standing balance-Leahy Scale Poor  Standing balance comment reliant on UEs and external assist   PT - End of Session  Equipment Utilized During Treatment Gait belt;Right knee immobilizer  Activity Tolerance Patient tolerated treatment well  Patient left in chair;with call bell/phone within reach;with chair alarm set;with family/visitor present  Nurse Communication Mobility status   PT - Assessment/Plan  PT Plan Current plan remains appropriate  PT Visit Diagnosis Unsteadiness on feet (R26.81);Difficulty in walking, not elsewhere classified (R26.2)  PT Frequency (ACUTE ONLY) 7X/week  Follow Up Recommendations Follow surgeon's recommendation for DC plan and follow-up therapies  PT equipment Rolling walker with 5" wheels  AM-PAC PT "6 Clicks" Mobility Outcome Measure (Version 2)  Help needed turning from your back to your side while in a flat bed without using bedrails? 3  Help needed moving from lying on your back to sitting on the side of a flat bed without using bedrails? 3  Help needed moving to and from a bed to a chair (including a wheelchair)? 3  Help needed standing up from a chair using your arms (e.g., wheelchair or bedside chair)? 3  Help needed to walk in hospital room? 3  Help needed climbing 3-5 steps with a railing?  2  6 Click Score 17  Consider Recommendation of Discharge To: Home with HH, assist for mobility   PT Goal Progression  Progress  towards PT goals Progressing toward goals  Acute Rehab PT Goals  PT Goal Formulation With patient  Time For Goal Achievement 07/12/19  Potential to Achieve Goals Good  PT Time Calculation  PT Start Time (ACUTE ONLY) 1410  PT Stop Time (ACUTE ONLY) 1433  PT Time Calculation (min) (ACUTE ONLY) 23 min  PT General Charges  $$ ACUTE PT VISIT 1 Visit  PT Treatments  $Gait Training 23-37 mins

## 2019-07-07 NOTE — Progress Notes (Signed)
Physical Therapy Treatment Patient Details Name: Christine Mckay MRN: [id number] DOB: 1941/03/22 Today's Date: 07/07/2019    History of Present Illness s/p R TKA. PMH: HTN    PT Comments    Pt continues to have posterior LOB on standing. amb 50'. Will see for a second session in pm    Follow Up Recommendations  Follow surgeon's recommendation for DC plan and follow-up therapies     Equipment Recommendations  Rolling walker with 5" wheels    Recommendations for Other Services       Precautions / Restrictions Precautions Precautions: Fall;Knee Required Braces or Orthoses: Knee Immobilizer - Right Knee Immobilizer - Right: Discontinue once straight leg raise with < 10 degree lag Restrictions Weight Bearing Restrictions: No Other Position/Activity Restrictions: WBAT    Mobility  Bed Mobility Overal bed mobility: Needs Assistance Bed Mobility: Supine to Sit     Supine to sit: Min assist     General bed mobility comments: assist with RLE off bed, incr time and effort   Transfers Overall transfer level: Needs assistance Equipment used: Rolling walker (2 wheeled) Transfers: Sit to/from Stand Sit to Stand: Min assist         General transfer comment: cues for hand placement and RLE position, posterior LOB on initial standing, mod assist to recover  Ambulation/Gait Ambulation/Gait assistance: Min assist Gait Distance (Feet): 50 Feet Assistive device: Rolling walker (2 wheeled) Gait Pattern/deviations: Step-to pattern;Decreased step length - left;Decreased stance time - right;Decreased weight shift to right;Wide base of support     General Gait Details: cues for sequence, trunk extension, to incr step length and decr BOS   Stairs             Wheelchair Mobility    Modified Rankin (Stroke Patients Only)       Balance Overall balance assessment: Needs assistance Sitting-balance support: No upper extremity supported;Feet supported Sitting balance-Leahy  Scale: Fair     Standing balance support: Bilateral upper extremity supported;During functional activity Standing balance-Leahy Scale: Poor Standing balance comment: reliant on UEs and external assist                             Cognition Arousal/Alertness: Awake/alert Behavior During Therapy: WFL for tasks assessed/performed Overall Cognitive Status: Within Functional Limits for tasks assessed                                        Exercises Total Joint Exercises Ankle Circles/Pumps: AROM;Both;10 reps Quad Sets: AROM;Both;10 reps Heel Slides: AAROM;Right;10 reps    General Comments        Pertinent Vitals/Pain Pain Assessment: 0-10 Pain Score: 4  Pain Location: right knee Pain Descriptors / Indicators: Aching;Burning Pain Intervention(s): Limited activity within patient's tolerance;Monitored during session;Premedicated before session;Repositioned    Home Living                      Prior Function            PT Goals (current goals can now be found in the care plan section) Acute Rehab PT Goals Patient Stated Goal: home PT Goal Formulation: With patient Time For Goal Achievement: 07/12/19 Potential to Achieve Goals: Good Progress towards PT goals: Progressing toward goals    Frequency    7X/week      PT Plan Current plan remains appropriate  Co-evaluation              AM-PAC PT "6 Clicks" Mobility   Outcome Measure  Help needed turning from your back to your side while in a flat bed without using bedrails?: A Little Help needed moving from lying on your back to sitting on the side of a flat bed without using bedrails?: A Little Help needed moving to and from a bed to a chair (including a wheelchair)?: A Little Help needed standing up from a chair using your arms (e.g., wheelchair or bedside chair)?: A Little Help needed to walk in hospital room?: A Little Help needed climbing 3-5 steps with a railing? : A  Lot 6 Click Score: 17    End of Session Equipment Utilized During Treatment: Gait belt;Right knee immobilizer Activity Tolerance: Patient tolerated treatment well Patient left: in chair;with call bell/phone within reach;with chair alarm set;with nursing/sitter in room Nurse Communication: Mobility status PT Visit Diagnosis: Unsteadiness on feet (R26.81);Difficulty in walking, not elsewhere classified (R26.2)     Time: 6812-7517 PT Time Calculation (min) (ACUTE ONLY): 26 min  Charges:  $Gait Training: 23-37 mins                     Amberley Hamler, PT   Acute Rehab Dept [facility]): [phone]   07/07/2019    [facility] 07/07/2019, 12:35 PM

## 2019-07-07 NOTE — Anesthesia Postprocedure Evaluation (Signed)
Anesthesia Post Note  Patient: NARIYA NEUMEYER  Procedure(s) Performed: TOTAL KNEE ARTHROPLASTY (Right Knee)     Patient location during evaluation: PACU Anesthesia Type: Spinal and MAC Level of consciousness: oriented and awake and alert Pain management: pain level controlled Vital Signs Assessment: post-procedure vital signs reviewed and stable Respiratory status: spontaneous breathing, respiratory function stable and patient connected to nasal cannula oxygen Cardiovascular status: blood pressure returned to baseline and stable Postop Assessment: no headache, no backache and no apparent nausea or vomiting Anesthetic complications: no    Last Vitals:  Vitals:   07/06/19 2238 07/07/19 0520  BP: (!) 137/53 (!) 146/62  Pulse: 72 77  Resp: 18 18  Temp: 37.6 C 37.1 C  SpO2: 96% 96%    Last Pain:  Vitals:   07/07/19 0520  TempSrc: Oral  PainSc: [place]

## 2019-07-07 NOTE — Progress Notes (Signed)
   Subjective: 2 Days Post-Op Procedure(s) (LRB): TOTAL KNEE ARTHROPLASTY (Right) Patient reports pain as moderate.   Plan is to go Home after hospital stay.  Objective: Vital signs in last 24 hours: Temp:  [97.8 F (36.6 C)-99.6 F (37.6 C)] 98.8 F (37.1 C) (04/07 0520) Pulse Rate:  [72-77] 77 (04/07 0520) Resp:  [16-18] 18 (04/07 0520) BP: (137-149)/(53-69) 146/62 (04/07 0520) SpO2:  [96 %-99 %] 96 % (04/07 0520)  Intake/Output from previous day:  Intake/Output Summary (Last 24 hours) at 07/07/2019 0713 Last data filed at 07/07/2019 0600 Gross per 24 hour  Intake 840 ml  Output 500 ml  Net 340 ml    Intake/Output this shift: No intake/output data recorded.  Labs: Recent Labs    07/06/19 0320 07/07/19 0325  HGB 10.9* 10.6*   Recent Labs    07/06/19 0320 07/07/19 0325  WBC 14.1* 14.1*  RBC 3.60* 3.52*  HCT 34.5* 34.0*  PLT 253 238   Recent Labs    07/06/19 0752 07/07/19 0325  NA 135 132*  K 4.5 4.6  CL 100 98  CO2 26 28  BUN 16 18  CREATININE 0.69 0.59  GLUCOSE 116* 124*  CALCIUM 9.2 9.0   No results for input(s): LABPT, INR in the last 72 hours.  EXAM General - Patient is Alert, Appropriate and Oriented Extremity - Neurologically intact Neurovascular intact No cellulitis present Compartment soft Dressing/Incision - clean, dry no drainage Motor Function - intact, moving foot and toes well on exam.   Past Medical History:  Diagnosis Date  . Hypertension   . Vertigo     Assessment/Plan: 2 Days Post-Op Procedure(s) (LRB): TOTAL KNEE ARTHROPLASTY (Right) Principal Problem:   OA (osteoarthritis) of knee Active Problems:   Primary osteoarthritis of right knee   Up with therapy  Discharge home today   Ollen Gross 07/07/2019, 7:13 AM

## 2019-11-06 ENCOUNTER — Encounter (HOSPITAL_BASED_OUTPATIENT_CLINIC_OR_DEPARTMENT_OTHER): Payer: Self-pay | Admitting: *Deleted

## 2019-11-06 ENCOUNTER — Emergency Department (HOSPITAL_BASED_OUTPATIENT_CLINIC_OR_DEPARTMENT_OTHER): Payer: Medicare PPO

## 2019-11-06 ENCOUNTER — Other Ambulatory Visit: Payer: Self-pay

## 2019-11-06 ENCOUNTER — Emergency Department (HOSPITAL_BASED_OUTPATIENT_CLINIC_OR_DEPARTMENT_OTHER)
Admission: EM | Admit: 2019-11-06 | Discharge: 2019-11-06 | Disposition: A | Discharge status: Home / Self Care | Payer: Medicare PPO | Attending: Emergency Medicine | Admitting: Emergency Medicine

## 2019-11-06 DIAGNOSIS — Z96651 Presence of right artificial knee joint: Secondary | ICD-10-CM | POA: Diagnosis not present

## 2019-11-06 DIAGNOSIS — Z8249 Family history of ischemic heart disease and other diseases of the circulatory system: Secondary | ICD-10-CM | POA: Diagnosis not present

## 2019-11-06 DIAGNOSIS — M4802 Spinal stenosis, cervical region: Secondary | ICD-10-CM | POA: Insufficient documentation

## 2019-11-06 DIAGNOSIS — M4722 Other spondylosis with radiculopathy, cervical region: Secondary | ICD-10-CM | POA: Diagnosis not present

## 2019-11-06 DIAGNOSIS — R42 Dizziness and giddiness: Secondary | ICD-10-CM | POA: Insufficient documentation

## 2019-11-06 DIAGNOSIS — G9520 Unspecified cord compression: Secondary | ICD-10-CM | POA: Diagnosis not present

## 2019-11-06 DIAGNOSIS — I6782 Cerebral ischemia: Secondary | ICD-10-CM | POA: Diagnosis not present

## 2019-11-06 DIAGNOSIS — I10 Essential (primary) hypertension: Secondary | ICD-10-CM | POA: Insufficient documentation

## 2019-11-06 DIAGNOSIS — R2689 Other abnormalities of gait and mobility: Secondary | ICD-10-CM | POA: Diagnosis not present

## 2019-11-06 LAB — CBC WITH DIFFERENTIAL/PLATELET
Abs Immature Granulocytes: 0.06 10*3/uL (ref 0.00–0.07)
Basophils Absolute: 0.1 10*3/uL (ref 0.0–0.1)
Basophils Relative: 1 %
Eosinophils Absolute: 0.1 10*3/uL (ref 0.0–0.5)
Eosinophils Relative: 1 %
HCT: 37.5 % (ref 36.0–46.0)
Hemoglobin: 11.9 g/dL — ABNORMAL LOW (ref 12.0–15.0)
Immature Granulocytes: 1 %
Lymphocytes Relative: 15 %
Lymphs Abs: 1.8 10*3/uL (ref 0.7–4.0)
MCH: 28.5 pg (ref 26.0–34.0)
MCHC: 31.7 g/dL (ref 30.0–36.0)
MCV: 89.9 fL (ref 80.0–100.0)
Monocytes Absolute: 0.8 10*3/uL (ref 0.1–1.0)
Monocytes Relative: 6 %
Neutro Abs: 9.7 10*3/uL — ABNORMAL HIGH (ref 1.7–7.7)
Neutrophils Relative %: 76 %
Platelets: 314 10*3/uL (ref 150–400)
RBC: 4.17 MIL/uL (ref 3.87–5.11)
RDW: 14.4 % (ref 11.5–15.5)
WBC: 12.5 10*3/uL — ABNORMAL HIGH (ref 4.0–10.5)
nRBC: 0 % (ref 0.0–0.2)

## 2019-11-06 LAB — COMPREHENSIVE METABOLIC PANEL
ALT: 21 U/L (ref 0–44)
AST: 25 U/L (ref 15–41)
Albumin: 4.2 g/dL (ref 3.5–5.0)
Alkaline Phosphatase: 81 U/L (ref 38–126)
Anion gap: 10 (ref 5–15)
BUN: 13 mg/dL (ref 8–23)
CO2: 22 mmol/L (ref 22–32)
Calcium: 9.3 mg/dL (ref 8.9–10.3)
Chloride: 101 mmol/L (ref 98–111)
Creatinine, Ser: 0.93 mg/dL (ref 0.44–1.00)
GFR calc Af Amer: >60 mL/min (ref >60)
GFR calc non Af Amer: 59 mL/min — ABNORMAL LOW (ref >60)
Glucose, Bld: 119 mg/dL — ABNORMAL HIGH (ref 70–99)
Potassium: 3.9 mmol/L (ref 3.5–5.1)
Sodium: 133 mmol/L — ABNORMAL LOW (ref 135–145)
Total Bilirubin: 0.2 mg/dL — ABNORMAL LOW (ref 0.3–1.2)
Total Protein: 7.5 g/dL (ref 6.5–8.1)

## 2019-11-06 LAB — URINALYSIS, ROUTINE W REFLEX MICROSCOPIC
Bilirubin Urine: NEGATIVE
Glucose, UA: NEGATIVE mg/dL
Hgb urine dipstick: NEGATIVE
Ketones, ur: NEGATIVE mg/dL
Leukocytes,Ua: NEGATIVE
Nitrite: NEGATIVE
Protein, ur: NEGATIVE mg/dL
Specific Gravity, Urine: >1.03 — ABNORMAL HIGH (ref 1.005–1.030)
pH: 6.5 (ref 5.0–8.0)

## 2019-11-06 MED ORDER — MECLIZINE HCL 25 MG PO TABS
12.5000 mg | ORAL_TABLET | Freq: Once | ORAL | Status: AC
  Administered 2019-11-06: 12.5 mg via ORAL
  Filled 2019-11-06: qty 1

## 2019-11-06 MED ORDER — MECLIZINE HCL 25 MG PO TABS: 25.0000 mg | ORAL_TABLET | Freq: Three times a day (TID) | ORAL | 0 refills | Status: AC | PRN

## 2019-11-06 MED ORDER — METHYLPREDNISOLONE 4 MG PO TBPK: ORAL_TABLET | ORAL | 0 refills | Status: DC

## 2019-11-06 MED ORDER — DEXAMETHASONE SODIUM PHOSPHATE 10 MG/ML IJ SOLN
10.0000 mg | Freq: Once | INTRAMUSCULAR | Status: AC
  Administered 2019-11-06: 10 mg via INTRAVENOUS
  Filled 2019-11-06: qty 1

## 2019-11-06 NOTE — ED Provider Notes (Signed)
  Physical Exam  BP 137/61   Pulse 70   Temp 98.2 F (36.8 C) (Oral)   Resp 10   Ht 5\' 6"  (1.676 m)   Wt 95.3 kg   SpO2 97%   BMI 33.89 kg/m   Physical Exam  ED Course/Procedures     Procedures  MDM  Care assumed at 3:30 PM.  Patient has some dizziness and MRI was ordered to rule out posterior circulation stroke.  4:30 pm MRI brain showed C3-4 possible cord compression.  Patient states that she has some numbness and tingling in the left arm that is going on for weeks to a month.  She denies any neck pain.  She denies trouble walking.  I examined her head and she has normal bicep flexion and triceps extension and normal hand grasp bilaterally.  She also has normal reflexes in upper lower extremity.  She also has normal gait and normal hip flexion and extension and knee flexion extension bilaterally.  Will get MRI cervical spine  7:59 PM MRI cervical spine showed C3-4 cord compression but there is no abnormality in the cord signal.  This is likely a chronic process since she is neurovascular intact. I called Dr. from neurosurgery. He agreed with medrol dose pack. He will have patient follow-up with him in several weeks.  Patient's dizziness resolved with meclizine.  I think likely peripheral vertigo causing her dizziness. She can follow up with neurosurgery for her cervical radiculopathy      Jake Samples, MD 11/06/19 2001

## 2019-11-06 NOTE — ED Provider Notes (Signed)
Emergency Department Provider Note   I have reviewed the triage vital signs and the nursing notes.   HISTORY  Chief Complaint Dizziness   HPI Christine Mckay is a 79 y.o. female with PMH of HTN and vertigo presents to the emergency department evaluation of gait instability with dizzy feeling.  Symptoms began earlier today.  Patient states symptoms are worse with standing and movement but she does feel like there is some movement even with sitting still.  She is not experiencing speech difficulty, difficulty swallowing, obvious unilateral weakness, numbness.  She did have some ringing in her ears yesterday but that is stopped.  She denies chest pain, shortness of breath, URI type symptoms.  She states that rather than her typical vertigo/spinning symptoms she feels like if she is looking at the clock on the wall its drifting off to the left but then she can refocus and it is in the proper location. Denies HA.    Past Medical History:  Diagnosis Date  . Hypertension   . Vertigo     Patient Active Problem List   Diagnosis Date Noted  . OA (osteoarthritis) of knee 07/05/2019  . Primary osteoarthritis of right knee 07/05/2019  . Hypertension     Past Surgical History:  Procedure Laterality Date  . CATARACT EXTRACTION  2003 AND 2004  . LASER SURGERY OF EYE    . PILONIDAL CYST EXCISION  1975  . TOTAL KNEE ARTHROPLASTY Right 07/05/2019   Procedure: TOTAL KNEE ARTHROPLASTY;  Surgeon: Ollen Gross, MD;  Location: [facility]S;  Service: Orthopedics;  Laterality: Right;     Allergies Bextra [valdecoxib] and Nitrofurantoin  Family History  Problem Relation Age of Onset  . Hypertension Sister   . Heart disease Brother   . Breast cancer Maternal Aunt        Age 16's  . Breast cancer Cousin        MATERNAL Age 75  . Heart disease Father   . Hypertension Father     Social History Social History   Tobacco Use  . Smoking status: Never Smoker  . Smokeless tobacco: Never Used   Vaping Use  . Vaping Use: Never used  Substance Use Topics  . Alcohol use: No    Alcohol/week: 0.0 standard drinks    Comment: socially  . Drug use: No    Review of Systems  Constitutional: No fever/chills Eyes: No visual changes. ENT: No sore throat. Cardiovascular: Denies chest pain. Respiratory: Denies shortness of breath. Gastrointestinal: No abdominal pain.  No nausea, no vomiting.  No diarrhea.  No constipation. Genitourinary: Negative for dysuria. Musculoskeletal: Negative for back pain. Skin: Negative for rash. Neurological: Negative for headaches, focal weakness or numbness. Positive gait instability and vertigo type symptoms.   10-point ROS otherwise negative.  ____________________________________________   PHYSICAL EXAM:  VITAL SIGNS: ED Triage Vitals  Enc Vitals Group     BP 11/06/19 1318 (!) 150/67     Pulse Rate 11/06/19 1318 77     Resp 11/06/19 1318 16     Temp 11/06/19 1318 98.2 F (36.8 C)     Temp Source 11/06/19 1318 Oral     SpO2 11/06/19 1318 98 %     Weight 11/06/19 1331 210 lb (95.3 kg)     Height 11/06/19 1331 5\' 6"  (1.676 m)   Constitutional: Alert and oriented. Well appearing and in no acute distress. Eyes: Conjunctivae are normal. PERRL. EOMI. Head: Atraumatic. Ears:  Healthy appearing ear canals and TMs  bilaterally Nose: No congestion/rhinnorhea. Mouth/Throat: Mucous membranes are moist.   Neck: No stridor.   Cardiovascular: Normal rate, regular rhythm. Good peripheral circulation. Grossly normal heart sounds.   Respiratory: Normal respiratory effort.  No retractions. Lungs CTAB. Gastrointestinal: Soft and nontender. No distention.  Musculoskeletal: No lower extremity tenderness nor edema. No gross deformities of extremities. Neurologic:  Normal speech and language. No gross focal neurologic deficits are appreciated.  No facial asymmetry.  5-5 strength in the bilateral upper and lower extremities.  Normal finger-to-nose testing.   Normal heel-to-shin testing.  Skin:  Skin is warm, dry and intact. No rash noted.   ____________________________________________   LABS (all labs ordered are listed, but only abnormal results are displayed)  Labs Reviewed  COMPREHENSIVE METABOLIC PANEL - Abnormal; Notable for the following components:      Result Value   Sodium 133 (*)    Glucose, Bld 119 (*)    Total Bilirubin 0.2 (*)    GFR calc non Af Amer 59 (*)    All other components within normal limits  CBC WITH DIFFERENTIAL/PLATELET - Abnormal; Notable for the following components:   WBC 12.5 (*)    Hemoglobin 11.9 (*)    Neutro Abs 9.7 (*)    All other components within normal limits  URINALYSIS, ROUTINE W REFLEX MICROSCOPIC - Abnormal; Notable for the following components:   Specific Gravity, Urine >1.030 (*)    All other components within normal limits  URINE CULTURE   ____________________________________________  EKG   EKG Interpretation  Date/Time:  Saturday November 06 2019 13:29:17 EDT Ventricular Rate:  78 PR Interval:    QRS Duration: 99 QT Interval:  375 QTC Calculation: 428 R Axis:   6 Text Interpretation: Sinus rhythm Atrial premature complex No STEMI Confirmed by Alona Bene [phone]) on 11/06/2019 3:25:41 PM       ____________________________________________  RADIOLOGY  MR BRAIN WO CONTRAST  Result Date: 11/06/2019 CLINICAL DATA:  Dizziness EXAM: MRI HEAD WITHOUT CONTRAST TECHNIQUE: Multiplanar, multiecho pulse sequences of the brain and surrounding structures were obtained without intravenous contrast. COMPARISON:  None. FINDINGS: Brain: Generalized atrophy. Patchy white matter hyperintensity bilaterally. Brainstem and cerebellum normal. Negative for acute infarct.  Negative for hemorrhage or mass CSF filled cyst to the right of the posterior falx compatible with arachnoid cyst measures 17 x 19 mm. No edema in the adjacent brain. Vascular: Normal arterial flow voids Skull and upper cervical spine:  No focal skeletal lesion Large central disc protrusion and cord compression at C3-4. Sinuses/Orbits: Mild mucosal edema paranasal sinuses. Bilateral cataract extraction Other: None IMPRESSION: Atrophy and chronic microvascular ischemic change.  No acute infarct Incidental arachnoid cyst right parietal region. Large central disc protrusion and cord compression C3-4. These results were called by telephone at the time of interpretation on 11/06/2019 at 4:36 pm to provider Silverio Lay, who verbally acknowledged these results. Electronically Signed   By: Marlan Palau M.D.   On: 11/06/2019 16:32   MR Cervical Spine Wo Contrast  Result Date: 11/06/2019 CLINICAL DATA:  Cervical radiculopathy.  Abnormal MRI head. EXAM: MRI CERVICAL SPINE WITHOUT CONTRAST TECHNIQUE: Multiplanar, multisequence MR imaging of the cervical spine was performed. No intravenous contrast was administered. COMPARISON:  MRI head earlier today FINDINGS: Alignment: Slight anterolisthesis C2-3.  Slight retrolisthesis C4-5. Vertebrae: Normal bone marrow.  Negative for fracture or mass Cord: Cord compression at C3-4 due to disc protrusion. No cord signal abnormality is identified Posterior Fossa, vertebral arteries, paraspinal tissues: Negative Disc levels: C2-3: Negative C3-4: Moderate to  large central disc protrusion with cord compression. There is moderate spinal stenosis. The disc protrusion focally indents the cord in the midline. Neural foramina patent bilaterally. C4-5: Disc degeneration and diffuse uncinate spurring left greater than right. Moderate spinal stenosis with diffuse cord flattening left greater than right. Moderate left foraminal stenosis due to spurring. C5-6: Disc degeneration with diffuse uncinate spurring. Moderate spinal stenosis and moderate foraminal stenosis bilaterally due to spurring. C6-7: Disc degeneration with diffuse uncinate spurring. Moderate left foraminal stenosis due to spurring. Mild spinal stenosis C7-T1: Negative  IMPRESSION: Moderate to large central disc protrusion at C3-4 with significant cord flattening and mild cord compression. Spinal cord signal normal. 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 Electronically Signed   By: Marlan Palau M.D.   On: 11/06/2019 18:58    ____________________________________________   PROCEDURES  Procedure(s) performed:   Procedures  None  ____________________________________________   INITIAL IMPRESSION / ASSESSMENT AND PLAN / ED COURSE  Pertinent labs & imaging results that were available during my care of the patient were reviewed by me and considered in my medical decision making (see chart for details).   Patient presents to the emergency department with feeling off balance and gait instability.  Some visual disturbance which is likely peripheral vertigo in nature but I am unable to reproduce this on exam.  She does have some symptoms at rest.  Time of onset is not completely clear and the patient does not have focal neurologic deficit on my exam.  No code stroke but I am considering central vertigo etiology.  Plan for MRI brain without contrast along with labs.  EKG is reassuring with no acute ischemic findings.   MRI pending. Care transferred to Dr. Silverio Lay.  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[facility])     MEDICATIONS GIVEN DURING THIS VISIT:  Medications  meclizine (ANTIVERT) tablet 12.5 mg (12.5 mg Oral Given 11/06/19 1533)  dexamethasone (DECADRON) injection 10 mg (10 mg Intravenous Given 11/06/19 1946)     NEW OUTPATIENT MEDICATIONS STARTED DURING THIS VISIT:  Discharge Medication List as of 11/06/2019  8:03 PM    START taking these medications   Details  meclizine (ANTIVERT) 25 MG tablet Take 1 tablet (25 mg total) by mouth 3 (three) times daily as needed for  dizziness., Starting Sat 11/06/2019, Print    methylPREDNISolone (MEDROL DOSEPAK) 4 MG TBPK tablet Use as directed, Print        Note:  This document was prepared using Dragon voice recognition software and may include unintentional dictation errors.  Alona Bene, MD, [facility] Emergency Medicine    Raghad Lorenz, Arlyss Repress, MD 11/07/19 [phone]

## 2019-11-06 NOTE — ED Triage Notes (Addendum)
Pt reports onset of dizziness around 1 am. States she felt better this morning but now she is dizzy again. States she has had vertigo in the past but this feels different. Also states she saw her urologist this week and was dx with UTI and was given 2 new medications

## 2019-11-06 NOTE — ED Notes (Signed)
Assisted up to [facility], denies dizziness at present, steady on feet

## 2019-11-06 NOTE — Discharge Instructions (Signed)
Here MRI brain today did not show any stroke.  You likely have vertigo and can take meclizine as needed.  However your MRI did show a compression of one of your nerves in your neck.  Take Medrol Dosepak as prescribed.  You need to follow-up with neurosurgery in 2 to 3 weeks.  Return to ER if you have worse dizziness, weakness, numbness, trouble walking, trouble speaking

## 2019-11-06 NOTE — ED Notes (Signed)
ED Provider at bedside. 

## 2019-11-06 NOTE — ED Notes (Signed)
Taken to MRI at this time

## 2019-11-06 NOTE — ED Notes (Signed)
BEFAST assessment is negative.  

## 2019-11-06 NOTE — ED Notes (Signed)
Pt states she is experiencing weak legs and states when she stands and walks feels dizzy, denies chest pain, fever. Yesterday had ringing in her ears.

## 2019-11-08 ENCOUNTER — Telehealth: Payer: Self-pay | Admitting: Internal Medicine

## 2019-11-08 LAB — URINE CULTURE: Culture: <10000 — AB

## 2019-11-08 NOTE — Telephone Encounter (Signed)
Pt states she saw you this past Sat, given referral for Dr Jake Samples. Pt would like to now if you can refer her to a [place] in [place], closer to her home. Pt doesn't want to get to one from her dr, because she will have to make an appt to do so.  Pt would aprreciate any help you can give.

## 2020-01-03 ENCOUNTER — Other Ambulatory Visit (HOSPITAL_BASED_OUTPATIENT_CLINIC_OR_DEPARTMENT_OTHER): Payer: Self-pay | Admitting: Internal Medicine

## 2020-01-03 DIAGNOSIS — Z1231 Encounter for screening mammogram for malignant neoplasm of breast: Secondary | ICD-10-CM

## 2020-01-18 ENCOUNTER — Ambulatory Visit (HOSPITAL_BASED_OUTPATIENT_CLINIC_OR_DEPARTMENT_OTHER)
Admission: RE | Admit: 2020-01-18 | Discharge: 2020-01-18 | Disposition: A | Discharge status: Home / Self Care | Payer: Medicare PPO | Source: Ambulatory Visit | Attending: Internal Medicine | Admitting: Internal Medicine

## 2020-01-18 ENCOUNTER — Other Ambulatory Visit: Payer: Self-pay

## 2020-01-18 DIAGNOSIS — Z1231 Encounter for screening mammogram for malignant neoplasm of breast: Secondary | ICD-10-CM | POA: Diagnosis present

## 2020-12-25 ENCOUNTER — Other Ambulatory Visit (HOSPITAL_BASED_OUTPATIENT_CLINIC_OR_DEPARTMENT_OTHER): Payer: Self-pay | Admitting: Internal Medicine

## 2020-12-25 DIAGNOSIS — Z1231 Encounter for screening mammogram for malignant neoplasm of breast: Secondary | ICD-10-CM

## 2021-01-23 ENCOUNTER — Other Ambulatory Visit: Payer: Self-pay

## 2021-01-23 ENCOUNTER — Ambulatory Visit (HOSPITAL_BASED_OUTPATIENT_CLINIC_OR_DEPARTMENT_OTHER)
Admission: RE | Admit: 2021-01-23 | Discharge: 2021-01-23 | Disposition: A | Discharge status: Home / Self Care | Payer: Medicare PPO | Source: Ambulatory Visit | Attending: Internal Medicine | Admitting: Internal Medicine

## 2021-01-23 ENCOUNTER — Encounter (HOSPITAL_BASED_OUTPATIENT_CLINIC_OR_DEPARTMENT_OTHER): Payer: Self-pay

## 2021-01-23 DIAGNOSIS — Z1231 Encounter for screening mammogram for malignant neoplasm of breast: Secondary | ICD-10-CM | POA: Insufficient documentation

## 2021-07-04 ENCOUNTER — Other Ambulatory Visit: Payer: Self-pay

## 2021-07-04 ENCOUNTER — Emergency Department (HOSPITAL_BASED_OUTPATIENT_CLINIC_OR_DEPARTMENT_OTHER): Payer: Medicare PPO

## 2021-07-04 ENCOUNTER — Encounter (HOSPITAL_BASED_OUTPATIENT_CLINIC_OR_DEPARTMENT_OTHER): Payer: Self-pay | Admitting: *Deleted

## 2021-07-04 ENCOUNTER — Emergency Department (HOSPITAL_BASED_OUTPATIENT_CLINIC_OR_DEPARTMENT_OTHER)
Admission: EM | Admit: 2021-07-04 | Discharge: 2021-07-04 | Disposition: A | Discharge status: Home / Self Care | Payer: Medicare PPO | Attending: Emergency Medicine | Admitting: Emergency Medicine

## 2021-07-04 DIAGNOSIS — Z20822 Contact with and (suspected) exposure to covid-19: Secondary | ICD-10-CM | POA: Diagnosis not present

## 2021-07-04 DIAGNOSIS — R2681 Unsteadiness on feet: Secondary | ICD-10-CM | POA: Insufficient documentation

## 2021-07-04 DIAGNOSIS — W06XXXA Fall from bed, initial encounter: Secondary | ICD-10-CM | POA: Diagnosis not present

## 2021-07-04 DIAGNOSIS — M25511 Pain in right shoulder: Secondary | ICD-10-CM | POA: Insufficient documentation

## 2021-07-04 DIAGNOSIS — Z79899 Other long term (current) drug therapy: Secondary | ICD-10-CM | POA: Insufficient documentation

## 2021-07-04 DIAGNOSIS — Y92002 Bathroom of unspecified non-institutional (private) residence single-family (private) house as the place of occurrence of the external cause: Secondary | ICD-10-CM | POA: Insufficient documentation

## 2021-07-04 DIAGNOSIS — I10 Essential (primary) hypertension: Secondary | ICD-10-CM | POA: Insufficient documentation

## 2021-07-04 DIAGNOSIS — R531 Weakness: Secondary | ICD-10-CM | POA: Diagnosis present

## 2021-07-04 DIAGNOSIS — W19XXXA Unspecified fall, initial encounter: Secondary | ICD-10-CM

## 2021-07-04 LAB — CBC WITH DIFFERENTIAL/PLATELET
Abs Immature Granulocytes: 0.03 10*3/uL (ref 0.00–0.07)
Basophils Absolute: 0.1 10*3/uL (ref 0.0–0.1)
Basophils Relative: 1 %
Eosinophils Absolute: 0.1 10*3/uL (ref 0.0–0.5)
Eosinophils Relative: 1 %
HCT: 41.1 % (ref 36.0–46.0)
Hemoglobin: 13.5 g/dL (ref 12.0–15.0)
Immature Granulocytes: 0 %
Lymphocytes Relative: 18 %
Lymphs Abs: 1.9 10*3/uL (ref 0.7–4.0)
MCH: 30.4 pg (ref 26.0–34.0)
MCHC: 32.8 g/dL (ref 30.0–36.0)
MCV: 92.6 fL (ref 80.0–100.0)
Monocytes Absolute: 0.9 10*3/uL (ref 0.1–1.0)
Monocytes Relative: 8 %
Neutro Abs: 7.6 10*3/uL (ref 1.7–7.7)
Neutrophils Relative %: 72 %
Platelets: 272 10*3/uL (ref 150–400)
RBC: 4.44 MIL/uL (ref 3.87–5.11)
RDW: 14 % (ref 11.5–15.5)
WBC: 10.6 10*3/uL — ABNORMAL HIGH (ref 4.0–10.5)
nRBC: 0 % (ref 0.0–0.2)

## 2021-07-04 LAB — URINALYSIS, ROUTINE W REFLEX MICROSCOPIC
Bilirubin Urine: NEGATIVE
Glucose, UA: NEGATIVE mg/dL
Ketones, ur: NEGATIVE mg/dL
Nitrite: NEGATIVE
Protein, ur: NEGATIVE mg/dL
Specific Gravity, Urine: 1.015 (ref 1.005–1.030)
pH: 6.5 (ref 5.0–8.0)

## 2021-07-04 LAB — URINALYSIS, MICROSCOPIC (REFLEX)

## 2021-07-04 LAB — RESP PANEL BY RT-PCR (FLU A&B, COVID) ARPGX2
Influenza A by PCR: NEGATIVE
Influenza B by PCR: NEGATIVE
SARS Coronavirus 2 by RT PCR: NEGATIVE

## 2021-07-04 LAB — COMPREHENSIVE METABOLIC PANEL
ALT: 20 U/L (ref 0–44)
AST: 23 U/L (ref 15–41)
Albumin: 4.3 g/dL (ref 3.5–5.0)
Alkaline Phosphatase: 95 U/L (ref 38–126)
Anion gap: 9 (ref 5–15)
BUN: 15 mg/dL (ref 8–23)
CO2: 25 mmol/L (ref 22–32)
Calcium: 9.3 mg/dL (ref 8.9–10.3)
Chloride: 100 mmol/L (ref 98–111)
Creatinine, Ser: 0.83 mg/dL (ref 0.44–1.00)
GFR, Estimated: >60 mL/min (ref >60)
Glucose, Bld: 115 mg/dL — ABNORMAL HIGH (ref 70–99)
Potassium: 4.2 mmol/L (ref 3.5–5.1)
Sodium: 134 mmol/L — ABNORMAL LOW (ref 135–145)
Total Bilirubin: 0.4 mg/dL (ref 0.3–1.2)
Total Protein: 8.4 g/dL — ABNORMAL HIGH (ref 6.5–8.1)

## 2021-07-04 NOTE — ED Notes (Signed)
ED Provider at bedside. 

## 2021-07-04 NOTE — ED Notes (Signed)
Due to high fall risk, safety measures in place, family at bedside, instructed not to get up without staff in room, sr x 2 up, call bell within reach.  ?

## 2021-07-04 NOTE — ED Provider Notes (Signed)
?[facility] EMERGENCY DEPARTMENT ?Provider Note ? ? ?CSN: [id number] ?Arrival date & time: 07/04/21  1191 ? ?  ? ?History ? ?Chief Complaint  ?Patient presents with  ? Fall  ? ? ?Christine Mckay is a 81 y.o. female. ? ?HPI ? ?  ? ?81yo female with history of hypertension, unsteady gait, who presents with concern for fall and generalized weakness. ? ?Reports that since she had her previous knee surgery 3 years ago she has had some generalized weakness and difficulty ambulating.  Reports over the last year, it has been worse.  She had seen her outpatient provider regarding gait instability, including visits in September. ? ?Today, she had a fall while trying to get out of bed, and hit her right shoulder.  She reports right shoulder pain.  Reports that it had a fall 3 weeks ago, and hit her face, and since then she has had a greater fear of falling, and has been using her walker more.  She had difficulty getting up today after she fell.  Denies focal numbness, weakness, difficulty talking, visual changes or facial droop.  Had pain in the arm but just felt like she had difficulty getting up.  No syncope or near syncope.  Denies headaches. ? ?Was seen at urgent care with concern for cough.  She was prescribed Tessalon Perles.  She reports that she has had cough for the last several weeks.  She denies any shortness of breath, chest pain, congestion, fever, sore throat, abdominal pain, urinary symptoms, black or bloody stools, diarrhea, nausea, vomiting.   ? ?Past Medical History:  ?Diagnosis Date  ? Hypertension   ? Vertigo   ?  ? ? ? ? ? ?Home Medications ?Prior to Admission medications   ?Medication Sig Start Date End Date Taking? Authorizing Provider  ?benzonatate (TESSALON) 200 MG capsule Take 200 mg by mouth 3 (three) times daily as needed for cough.   Yes [provider]  ?ramipril (ALTACE) 5 MG capsule Take 5 mg by mouth daily.   Yes [provider]  ?conjugated estrogens (PREMARIN) vaginal  cream Place 1 Applicatorful vaginally 2 (two) times a week.    [provider]  ?desonide (DESOWEN) 0.05 % cream Apply 1 application topically daily as needed (cream).    [provider]  ?gabapentin (NEURONTIN) 100 MG capsule Take 2 capsules (200 mg total) by mouth 3 (three) times daily. Take 200 mg three times a day for two weeks following surgery.Then take 200 mg two times a day for two weeks. Then take 200 mg once a day for two weeks. Then discontinue. 07/06/19   Edmisten, Lyn Hollingshead, PA  ?meclizine (ANTIVERT) 25 MG tablet Take 1 tablet (25 mg total) by mouth 3 (three) times daily as needed for dizziness. 11/06/19   Charlynne Pander, MD  ?methocarbamol (ROBAXIN) 500 MG tablet Take 1 tablet (500 mg total) by mouth every 6 (six) hours as needed for muscle spasms. 07/06/19   Edmisten, Lyn Hollingshead, PA  ?nystatin-triamcinolone ointment (MYCOLOG) Apply 1 application topically 2 (two) times daily. ?Patient not taking: Reported on 06/23/2019 06/26/15   Harrington Challenger, NP  ?ramipril (ALTACE) 5 MG capsule Take 5 mg by mouth daily.  07/07/15   [provider]  ?rivaroxaban (XARELTO) 10 MG TABS tablet Take 1 tablet (10 mg total) by mouth daily with breakfast for 20 days. Then take one 81 mg aspirin once a day for three weeks. Then discontinue aspirin. 07/06/19 07/26/19  Edmisten, Lyn Hollingshead, PA  ?  traMADol (ULTRAM) 50 MG tablet Take 1-2 tablets (50-100 mg total) by mouth every 6 (six) hours as needed for moderate pain. 07/06/19   Edmisten, Lyn HollingsheadKristie L, PA  ?   ? ?Allergies    ?Bextra [valdecoxib] and Nitrofurantoin   ? ?Review of Systems   ?Review of Systems ? ?Physical Exam ?Updated Vital Signs ?BP (!) 160/91   Pulse 86   Temp 98.4 ?F (36.9 ?C) (Oral)   Resp (!) 26   Ht 5\' 6"  (1.676 m)   Wt 90.7 kg   SpO2 95%   BMI 32.28 kg/m?  ?Physical Exam ?Vitals and nursing note reviewed.  ?Constitutional:   ?   General: She is not in acute distress. ?   Appearance: She is well-developed. She is not diaphoretic.  ?HENT:   ?   Head: Normocephalic and atraumatic.  ?Eyes:  ?   Conjunctiva/sclera: Conjunctivae normal.  ?Cardiovascular:  ?   Rate and Rhythm: Normal rate and regular rhythm.  ?   Heart sounds: Normal heart sounds. No murmur heard. ?  No friction rub. No gallop.  ?Pulmonary:  ?   Effort: Pulmonary effort is normal. No respiratory distress.  ?   Breath sounds: Normal breath sounds. No wheezing or rales.  ?Abdominal:  ?   General: There is no distension.  ?   Palpations: Abdomen is soft.  ?   Tenderness: There is no abdominal tenderness. There is no guarding.  ?Musculoskeletal:     ?   General: No tenderness.  ?   Cervical back: Normal range of motion.  ?Skin: ?   General: Skin is warm and dry.  ?   Findings: No erythema or rash.  ?Neurological:  ?   Mental Status: She is alert and oriented to person, place, and time.  ? ? ?ED Results / Procedures / Treatments   ?Labs ?(all labs ordered are listed, but only abnormal results are displayed) ?Labs Reviewed  ?URINALYSIS, ROUTINE W REFLEX MICROSCOPIC - Abnormal; Notable for the following components:  ?    Result Value  ? Hgb urine dipstick TRACE (*)   ? Leukocytes,Ua TRACE (*)   ? All other components within normal limits  ?CBC WITH DIFFERENTIAL/PLATELET - Abnormal; Notable for the following components:  ? WBC 10.6 (*)   ? All other components within normal limits  ?COMPREHENSIVE METABOLIC PANEL - Abnormal; Notable for the following components:  ? Sodium 134 (*)   ? Glucose, Bld 115 (*)   ? Total Protein 8.4 (*)   ? All other components within normal limits  ?URINALYSIS, MICROSCOPIC (REFLEX) - Abnormal; Notable for the following components:  ? Bacteria, UA RARE (*)   ? All other components within normal limits  ?RESP PANEL BY RT-PCR (FLU A&B, COVID) ARPGX2  ? ? ?EKG ?EKG Interpretation ? ?Date/Time:  Wednesday July 04 2021 11:01:20 EDT ?Ventricular Rate:  89 ?PR Interval:  137 ?QRS Duration: 94 ?QT Interval:  351 ?QTC Calculation: 427 ?R Axis:   -10 ?Text Interpretation: Sinus  rhythm No significant change since last tracing Confirmed by Alvira MondaySchlossman, Jackee Glasner ([id number]) on 07/04/2021 11:07:08 AM ? ?Radiology ?DG Chest 2 View ? ?Result Date: 07/04/2021 ?CLINICAL DATA:  Generalized weakness, slip and fall, cough for 3 weeks EXAM: CHEST - 2 VIEW COMPARISON:  Chest radiograph 05/08/2021 FINDINGS: Cardiomediastinal silhouette is normal. There is no focal consolidation or pulmonary edema. There is no pleural effusion or pneumothorax There is no acute osseous abnormality. IMPRESSION: Stable chest with no radiographic evidence of acute cardiopulmonary process. Electronically  Signed   By: Lesia Hausen M.D.   On: 07/04/2021 09:28  ? ?DG Shoulder Right ? ?Result Date: 07/04/2021 ?CLINICAL DATA:  Right shoulder pain from fall. EXAM: RIGHT SHOULDER - 2+ VIEW COMPARISON:  Chest radiograph 07/04/2021 FINDINGS: Right shoulder is located without acute fracture. Visualized right ribs are intact. Normal alignment at the right [facility] joint. IMPRESSION: No acute bone abnormality to the right shoulder. Electronically Signed   By: Richarda Overlie M.D.   On: 07/04/2021 09:27  ? ?CT Head Wo Contrast ? ?Result Date: 07/04/2021 ?CLINICAL DATA:  Fall today. EXAM: CT HEAD WITHOUT CONTRAST TECHNIQUE: Contiguous axial images were obtained from the base of the skull through the vertex without intravenous contrast. RADIATION DOSE REDUCTION: This exam was performed according to the departmental dose-optimization program which includes automated exposure control, adjustment of the mA and/or kV according to patient size and/or use of iterative reconstruction technique. COMPARISON:  None. FINDINGS: Brain: Mild chronic ischemic white matter disease is noted. Mild diffuse cortical atrophy is noted. No mass effect or midline shift is noted. Ventricular size is within normal limits. There is no evidence of mass lesion, hemorrhage or acute infarction. Vascular: No hyperdense vessel or unexpected calcification. Skull: Normal. Negative for fracture or focal  lesion. Sinuses/Orbits: No acute finding. Other: None. IMPRESSION: No acute intracranial abnormality seen. Electronically Signed   By: Lupita Raider M.D.   On: 07/04/2021 09:28   ? ?Procedures ?Procedures

## 2021-07-04 NOTE — ED Notes (Signed)
C/o rt shoulder aching, has full ROM of RUE, color WNL, strong rt radial pulse noted, capillary refill WNL. Denies any changes in sensation in RUE, has strong rt hand grip.  ?

## 2021-07-04 NOTE — ED Triage Notes (Signed)
Per [facility] team, pt had fall this am, slipped out of bed, no head injury, "my feet slipped on the carpet" fell onto buttocks, pain mainly focused to rt shoulder area. Per EMS, gait is very unsteady. No LOC ?

## 2021-07-04 NOTE — ED Notes (Signed)
Pt transported to radiology.

## 2022-05-14 ENCOUNTER — Telehealth (HOSPITAL_BASED_OUTPATIENT_CLINIC_OR_DEPARTMENT_OTHER): Payer: Self-pay

## 2022-07-01 ENCOUNTER — Other Ambulatory Visit (HOSPITAL_BASED_OUTPATIENT_CLINIC_OR_DEPARTMENT_OTHER): Payer: Self-pay

## 2022-07-01 DIAGNOSIS — Z1231 Encounter for screening mammogram for malignant neoplasm of breast: Secondary | ICD-10-CM

## 2022-08-06 ENCOUNTER — Inpatient Hospital Stay (HOSPITAL_BASED_OUTPATIENT_CLINIC_OR_DEPARTMENT_OTHER): Admission: RE | Admit: 2022-08-06 | Payer: Medicare PPO | Source: Ambulatory Visit

## 2022-08-07 ENCOUNTER — Telehealth (HOSPITAL_BASED_OUTPATIENT_CLINIC_OR_DEPARTMENT_OTHER): Payer: Self-pay

## 2022-12-04 ENCOUNTER — Ambulatory Visit (HOSPITAL_COMMUNITY)
Admission: RE | Admit: 2022-12-04 | Discharge: 2022-12-04 | Disposition: A | Discharge status: Home / Self Care | Payer: Medicare PPO | Source: Ambulatory Visit | Attending: Cardiovascular Disease | Admitting: Cardiovascular Disease

## 2022-12-04 ENCOUNTER — Other Ambulatory Visit (HOSPITAL_COMMUNITY): Payer: Self-pay | Admitting: Physician Assistant

## 2022-12-04 DIAGNOSIS — M79604 Pain in right leg: Secondary | ICD-10-CM | POA: Insufficient documentation

## 2022-12-04 DIAGNOSIS — M79606 Pain in leg, unspecified: Secondary | ICD-10-CM

## 2022-12-04 DIAGNOSIS — M79605 Pain in left leg: Secondary | ICD-10-CM | POA: Diagnosis present

## 2022-12-04 DIAGNOSIS — M7989 Other specified soft tissue disorders: Secondary | ICD-10-CM | POA: Insufficient documentation

## 2023-02-12 IMAGING — CR DG SHOULDER 2+V*R*
3 series · 3 of 3 positions shown · non-contrast
Comparison: Chest radiograph 07/04/2021

CLINICAL DATA: Right shoulder pain from fall.

EXAM:
RIGHT SHOULDER - 2+ VIEW

[w shoulder grashey right *]
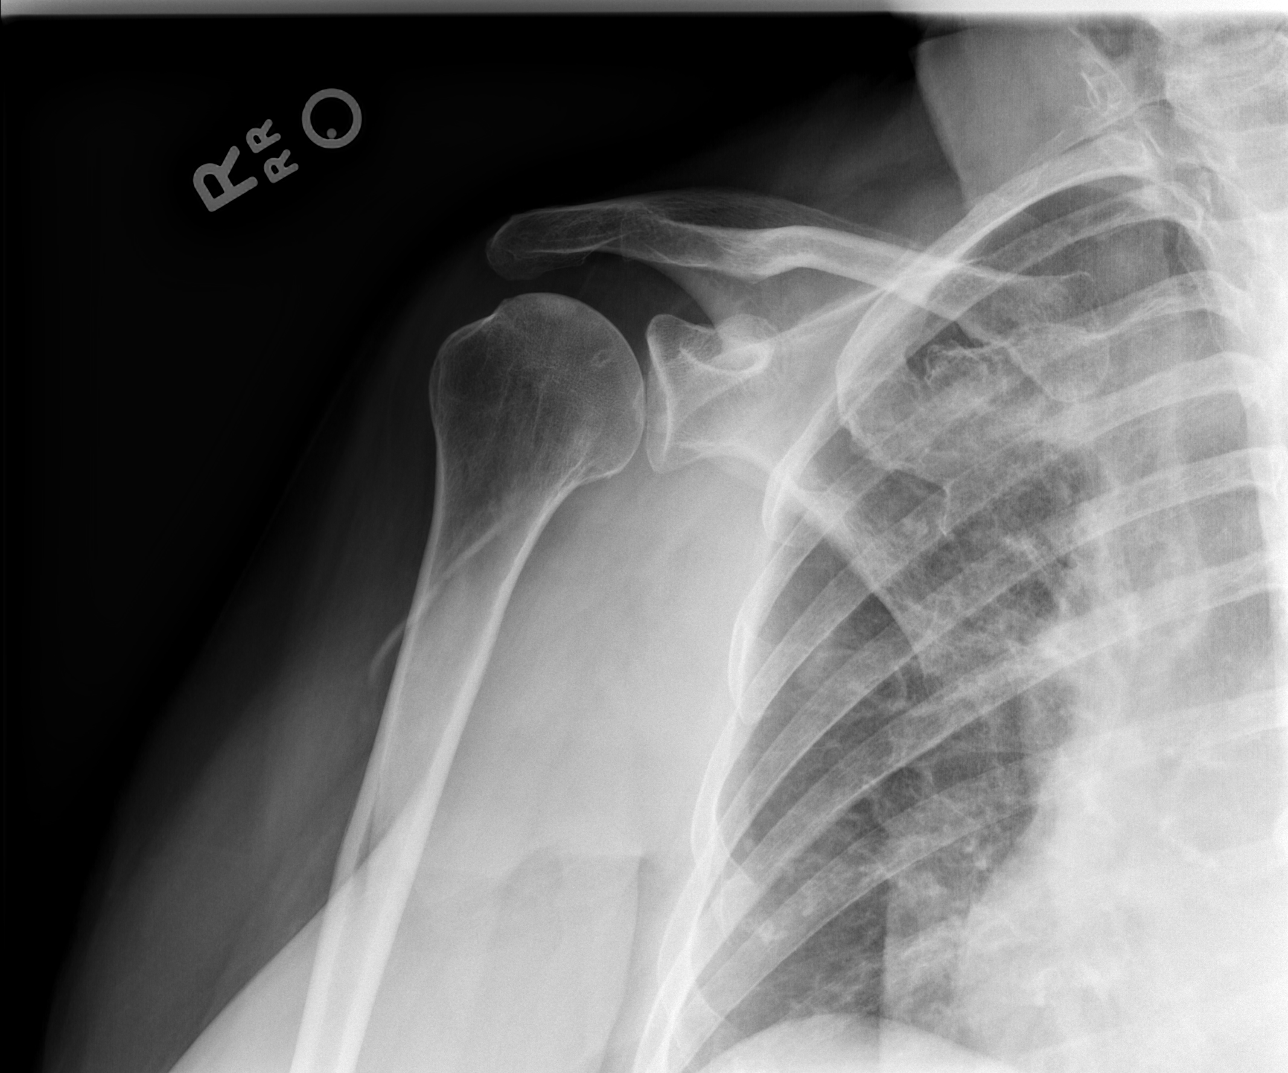

[w shoulder y view right *]
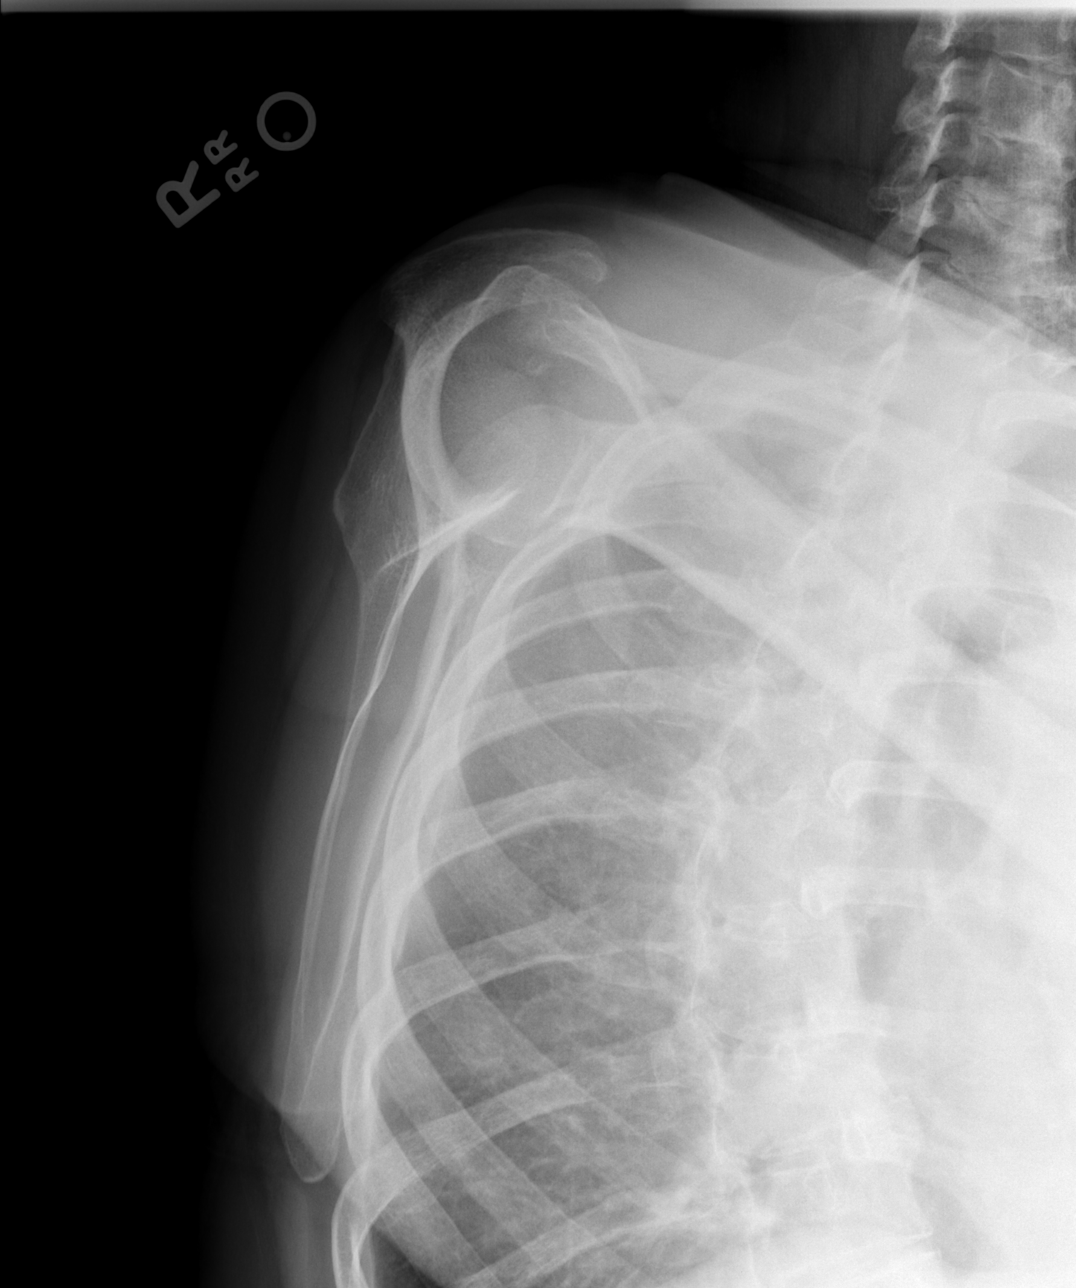

[x shoulder axillary right]
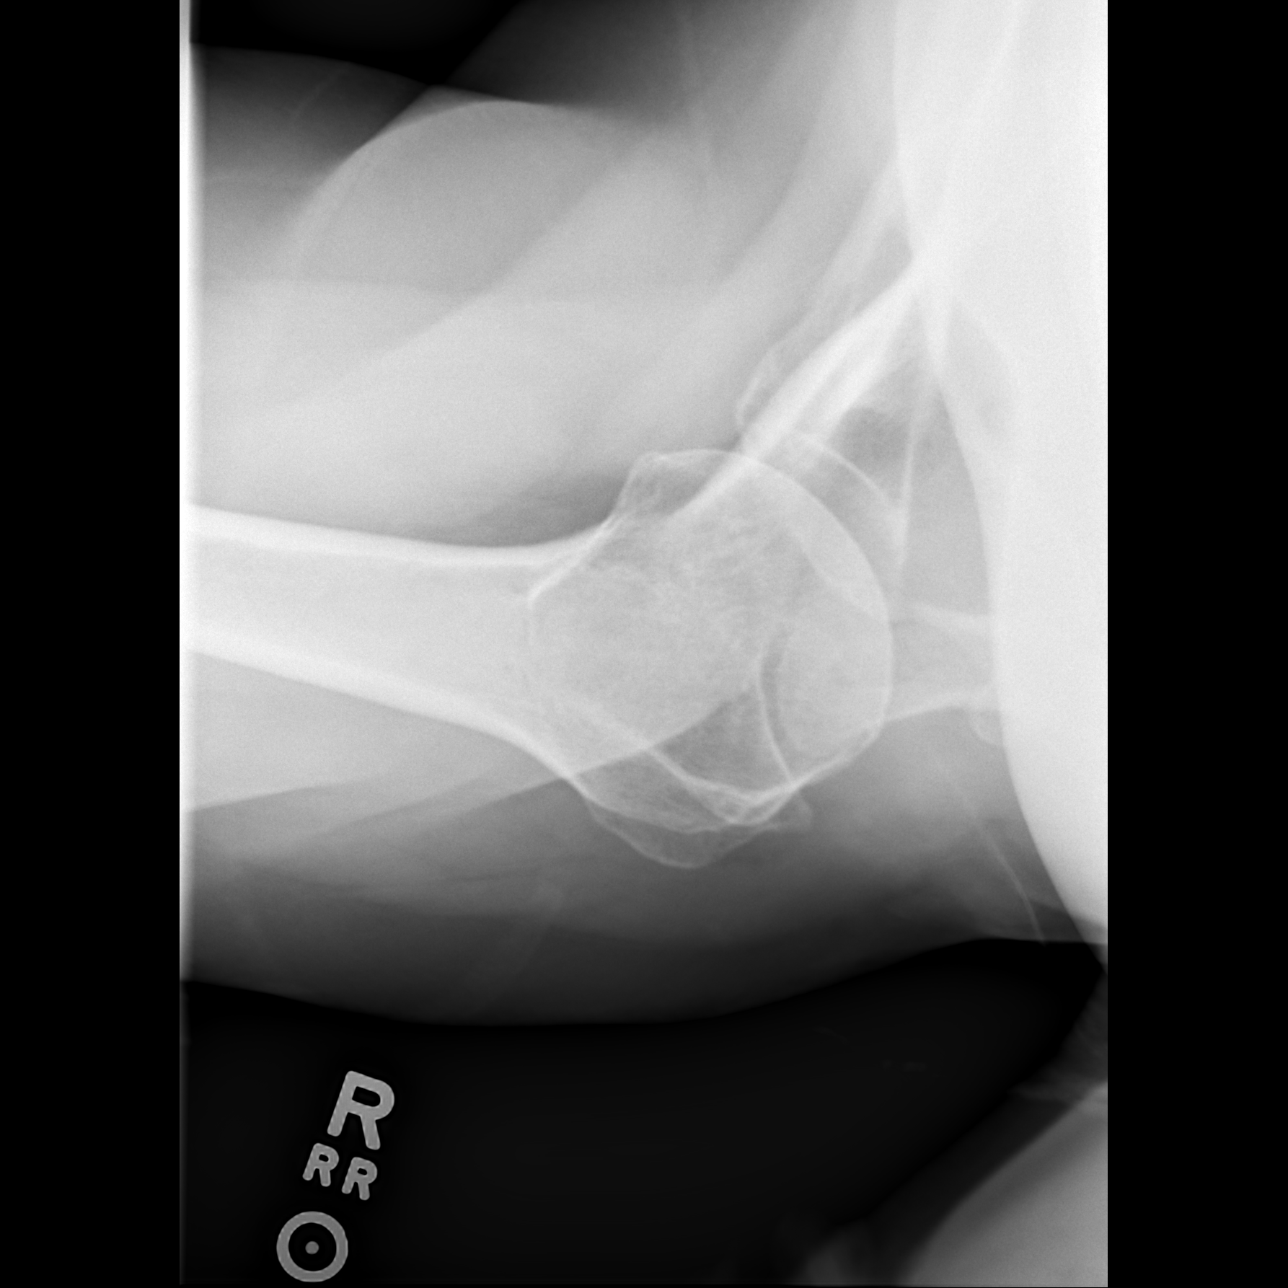

[3 of 3 positions shown; findings below may reference images not displayed]

FINDINGS: Right shoulder is located without acute fracture. Visualized right
ribs are intact. Normal alignment at the right AC joint.
IMPRESSION: No acute bone abnormality to the right shoulder.
